# Patient Record
Sex: Female | Born: 1960 | Hispanic: No | Marital: Single | Smoking: Never smoker
Health system: Southern US, Community
[De-identification: ages and names within clinical notes are randomized; demographics above are authoritative.]

## PROBLEM LIST (undated history)

## (undated) DIAGNOSIS — D219 Benign neoplasm of connective and other soft tissue, unspecified: Secondary | ICD-10-CM

## (undated) DIAGNOSIS — K5900 Constipation, unspecified: Secondary | ICD-10-CM

## (undated) DIAGNOSIS — M79604 Pain in right leg: Secondary | ICD-10-CM

## (undated) DIAGNOSIS — E785 Hyperlipidemia, unspecified: Secondary | ICD-10-CM

## (undated) DIAGNOSIS — I10 Essential (primary) hypertension: Secondary | ICD-10-CM

## (undated) DIAGNOSIS — R0600 Dyspnea, unspecified: Secondary | ICD-10-CM

## (undated) DIAGNOSIS — E669 Obesity, unspecified: Secondary | ICD-10-CM

## (undated) DIAGNOSIS — K760 Fatty (change of) liver, not elsewhere classified: Secondary | ICD-10-CM

## (undated) DIAGNOSIS — I700 Atherosclerosis of aorta: Secondary | ICD-10-CM

## (undated) DIAGNOSIS — R600 Localized edema: Secondary | ICD-10-CM

## (undated) DIAGNOSIS — R188 Other ascites: Secondary | ICD-10-CM

---

## 1998-03-18 ENCOUNTER — Other Ambulatory Visit: Admission: RE | Admit: 1998-03-18 | Discharge: 1998-03-18 | Payer: Self-pay | Admitting: *Deleted

## 1998-06-12 HISTORY — PX: DILATION AND CURETTAGE OF UTERUS: SHX78

## 1999-05-16 ENCOUNTER — Other Ambulatory Visit: Admission: RE | Admit: 1999-05-16 | Discharge: 1999-05-16 | Payer: Self-pay | Admitting: *Deleted

## 2006-04-11 ENCOUNTER — Other Ambulatory Visit: Admission: RE | Admit: 2006-04-11 | Discharge: 2006-04-11 | Payer: Self-pay | Admitting: *Deleted

## 2017-09-03 ENCOUNTER — Inpatient Hospital Stay (HOSPITAL_COMMUNITY)
Admission: AD | Admit: 2017-09-03 | Discharge: 2017-09-03 | Disposition: A | Discharge status: Home / Self Care | Payer: 59 | Source: Ambulatory Visit | Attending: Obstetrics & Gynecology | Admitting: Obstetrics & Gynecology

## 2017-09-03 ENCOUNTER — Ambulatory Visit (HOSPITAL_COMMUNITY)
Admission: EM | Admit: 2017-09-03 | Discharge: 2017-09-03 | Disposition: A | Discharge status: Home / Self Care | Payer: 59 | Source: Home / Self Care | Attending: Family Medicine | Admitting: Family Medicine

## 2017-09-03 ENCOUNTER — Encounter (HOSPITAL_COMMUNITY): Payer: Self-pay | Admitting: Emergency Medicine

## 2017-09-03 DIAGNOSIS — R102 Pelvic and perineal pain: Secondary | ICD-10-CM

## 2017-09-03 DIAGNOSIS — N950 Postmenopausal bleeding: Secondary | ICD-10-CM

## 2017-09-03 DIAGNOSIS — K5900 Constipation, unspecified: Secondary | ICD-10-CM | POA: Diagnosis not present

## 2017-09-03 HISTORY — DX: Essential (primary) hypertension: I10

## 2017-09-03 HISTORY — DX: Benign neoplasm of connective and other soft tissue, unspecified: D21.9

## 2017-09-03 LAB — URINALYSIS, ROUTINE W REFLEX MICROSCOPIC
Bilirubin Urine: NEGATIVE
Glucose, UA: NEGATIVE mg/dL
Ketones, ur: NEGATIVE mg/dL
Nitrite: NEGATIVE
PROTEIN: NEGATIVE mg/dL
SPECIFIC GRAVITY, URINE: 1.014 (ref 1.005–1.030)
pH: 6 (ref 5.0–8.0)

## 2017-09-03 NOTE — MAU Note (Signed)
Patient presents with pelvic pain.  Hx of uterine fibroid with scheduled surgery at [facility] on April 12th.  Pt presents with worsening pain of 8/10 and has taken Aleve which has "not touched" the pain.  C/o light spotting.  Also c/o constipation she believes is r/t the size of the fibroid.

## 2017-09-03 NOTE — Discharge Instructions (Signed)
You may check in at [facility] if you would like immediate evaluation or if symptoms are worsening. Otherwise I would recommend calling to [facility] to set up appointment to establish care and for second opinion. Ibuprofen or aleve as needed for pain. May try miralax to help promote bowel movements. If develop worsening of pain, fevers, bleeding, weakness, or otherwise not improving please go to Er.

## 2017-09-03 NOTE — ED Triage Notes (Signed)
Pt sts lower abd pain from fibroids; pt has D/C scheduled on 4/12; pt sts pain down into right leg; pt sts some vaginal bleeding; pt sts pain is severe

## 2017-09-03 NOTE — ED Provider Notes (Signed)
[facility]    CSN: [id number] Arrival date & time: 09/03/17  1041     History   Chief Complaint No chief complaint on file.   HPI Rebecca [name] is a 57 y.o. female.   Itzayana presents with complaints of persistent abdominal and back pain as well as vaginal bleeding. This has been ongoing since end of January although worse in the past month. She was seen in er in [place] and was found to have fibroids. 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 She is scheduled to have d&c for testing and evaluation of endometrium 4/12. Saw her gynecology 3/20 with complete exam. States her symptoms have not changed since then but has not improved. She states she is concerned about waiting until 4/12 and would like a second opinion and possible further intervention. She states she has small amount of vaginal spotting. She is post menopausal. Occasional low back pain. She has not taken any medications for pain. Pain at times radiates down her right leg. She feels she has been more constipated than usual without nausea or vomiting. She takes lisinopril but otherwise without contributing medical history. She is not sexually active. Ca125 was drawn, unable to see result in chart review at this time. Patient states she is seeking assistance in finding a second opinion with gynecology.     ROS per HPI.      Past Medical History:  Diagnosis Date  . Fibroids   . Hypertension     There are no active problems to display for this patient.   History reviewed. No pertinent surgical history.  OB History   None      Home Medications    Prior to Admission medications   Medication Sig Start Date End Date Taking? Authorizing Provider  lisinopril (PRINIVIL,ZESTRIL) 20 MG tablet Take by mouth daily.   Yes [provider]    Family History History reviewed. No pertinent family  history.  Social History Social History   Tobacco Use  . Smoking status: Never Smoker  . Smokeless tobacco: Never Used  Substance Use Topics  . Alcohol use: Never    Frequency: Never  . Drug use: Never     Allergies   Patient has no known allergies.   Review of Systems Review of Systems   Physical Exam Triage Vital Signs ED Triage Vitals  Enc Vitals Group     BP 09/03/17 1158 132/73     Pulse Rate 09/03/17 1158 97     Resp 09/03/17 1158 18     Temp 09/03/17 1158 98.5 F (36.9 C)     Temp Source 09/03/17 1158 Oral     SpO2 09/03/17 1158 100 %     Weight --      Height --      Head Circumference --      Peak Flow --      Pain Score 09/03/17 1159 8     Pain Loc --      Pain Edu? --      Excl. in [place]? --    No data found.  Updated Vital Signs BP 132/73 (BP Location: Left Arm)   Pulse 97   Temp 98.5 F (36.9 C) (Oral)   Resp 18   SpO2 100%   Visual Acuity Right Eye Distance:   Left Eye Distance:   Bilateral Distance:    Right Eye Near:   Left  Eye Near:    Bilateral Near:     Physical Exam  Constitutional: She is oriented to person, place, and time. She appears well-developed and well-nourished. No distress.  Cardiovascular: Normal rate, regular rhythm and normal heart sounds.  Pulmonary/Chest: Effort normal and breath sounds normal.  Abdominal: Soft. Bowel sounds are normal. She exhibits distension. There is no hepatosplenomegaly. There is tenderness in the right lower quadrant, suprapubic area and left lower quadrant. There is no tenderness at McBurney's point and negative Murphy's sign.  Genitourinary:  Genitourinary Comments: Patient declines pelvic exam, states exam was completed just last week; endorses vaginal bleeding   Musculoskeletal:       Lumbar back: Normal.  Neurological: She is alert and oriented to person, place, and time.  Skin: Skin is warm and dry.     UC Treatments / Results  Labs (all labs ordered are listed, but only  abnormal results are displayed) Labs Reviewed - No data to display  EKG None Radiology No results found.  Procedures Procedures (including critical care time)  Medications Ordered in UC Medications - No data to display   Initial Impression / Assessment and Plan / UC Course  I have reviewed the triage vital signs and the nursing notes.  Pertinent labs & imaging results that were available during my care of the patient were reviewed by me and considered in my medical decision making (see chart for details).    Plan of care discussed at length with patient and what options we have here in urgent care. Symptoms have not changed, are not new at this time. Seeking second opinion from another gynecologist and requesting assistance in this. Declines pelvic exam, pelvic pain work up and evaluation has been previously completed. Urine, chem 8, xray deferred at this time. Recommended call to [facility] to set up appointment for follow up. Patient states that she is interested in more rapid evaluation, interesting in going to [facility] ED. Stable at this time, without acute red flag findings or acute distress. Encouraged establish and follow with gynecology for definitive treatment. Patient verbalized understanding and agreeable to plan.  Ambulatory out of clinic without difficulty.    Final Clinical Impressions(s) / UC Diagnoses   Final diagnoses:  Pelvic pain  PMB (postmenopausal bleeding)    ED Discharge Orders    None       Controlled Substance Prescriptions [place] Controlled Substance Registry consulted? Not Applicable   Zigmund Gottron, NP 09/03/17 1250

## 2017-09-03 NOTE — MAU Provider Note (Signed)
[name] is a 57 y.o. female who presents to MAU today after being advised by urgent care to come here. She reports pelvic pain and constipation since January. She has been evaluated with CT and pelvic US which showed uterine fibroids. She is being followed for this by her Gynecologist in [place], and is scheduled for [facility] on April 12th. Since arrival she is stating she doesn't think she needs to be here and declines evaluation.  BP 129/82 (BP Location: Right Arm)   Pulse 100   Temp 98.8 F (37.1 C) (Oral)   Resp 16   Ht 5\' 7"  (1.702 m)   Wt 182 lb 12 oz (82.9 kg)   BMI 28.62 kg/m   CONSTITUTIONAL: Well-developed, well-nourished female in no acute distress.  MUSCULOSKELETAL: Normal range of motion.  CARDIOVASCULAR: Regular heart rate RESPIRATORY: Normal effort NEUROLOGICAL: Alert and oriented to person, place, and time.  SKIN: No pallor. PSYCH: Normal mood and affect. Normal behavior. Normal judgment and thought content.  No results found for this or any previous visit (from the past 24 hour(s)).  MDM Pt doesn't feel comfortable with the plan for D&C in April and wants to switch to Dr. Radene Knee for a GYN provider and get a second opinion. I again offered to evaluate her here today and she declined.  A: Pelvic pain Constipation  P: Discharge home Patient may return to MAU as needed or if her condition were to change or worsen   Julianne Handler, [place]  09/03/2017 2:22 PM

## 2017-09-12 ENCOUNTER — Ambulatory Visit (INDEPENDENT_AMBULATORY_CARE_PROVIDER_SITE_OTHER): Payer: 59 | Admitting: Family Medicine

## 2017-09-12 ENCOUNTER — Encounter: Payer: Self-pay | Admitting: *Deleted

## 2017-09-12 ENCOUNTER — Encounter: Payer: Self-pay | Admitting: Family Medicine

## 2017-09-12 VITALS — BP 155/79 | HR 120 | Ht 67.0 in | Wt 183.0 lb

## 2017-09-12 DIAGNOSIS — R109 Unspecified abdominal pain: Secondary | ICD-10-CM | POA: Insufficient documentation

## 2017-09-12 DIAGNOSIS — N950 Postmenopausal bleeding: Secondary | ICD-10-CM

## 2017-09-12 DIAGNOSIS — R971 Elevated cancer antigen 125 [CA 125]: Secondary | ICD-10-CM | POA: Diagnosis not present

## 2017-09-12 DIAGNOSIS — R140 Abdominal distension (gaseous): Secondary | ICD-10-CM | POA: Diagnosis not present

## 2017-09-12 DIAGNOSIS — D259 Leiomyoma of uterus, unspecified: Secondary | ICD-10-CM

## 2017-09-12 DIAGNOSIS — R1030 Lower abdominal pain, unspecified: Secondary | ICD-10-CM

## 2017-09-12 NOTE — Progress Notes (Signed)
Pt was seen at [facility] buy Dr. Octaviano Glow who recommends EMB d/t EMS not being able to be measured on [place] and PMB.  Pt states Dr. Ethelene Hal attempted to do EMB but was not successful in office. She had D&C with EMB scheduled in OR but has since canceled that procedure.  She still wants to have procedure done but prefers [facility] and considered hysterectomy since CA125 is elevated.

## 2017-09-12 NOTE — Patient Instructions (Addendum)
You have constipation which is hard stools that are difficult to pass. It is important to have regular bowel movements every 1-3 days that are soft and easy to pass. Hard stools increase your risk of hemorrhoids and are very uncomfortable.   To prevent constipation you can increase the amount of fiber in your diet. Examples of foods with fiber are leafy greens, whole grain breads, oatmeal and other grains.  It is also important to drink at least eight 8oz glass of water everyday.   If you have not has a bowel movement in 4-5 days you made need to clean out your bowel.  This will have establish normal movement through your bowel.    After you are cleaned out: - Start Colace100mg  twice daily - Start Miralax once daily - Start a daily fiber supplement like metamucil or citrucel - if you are having diarrhea you can reduce to Colace once a day or miralax every other day or a 1/2 capful daily.

## 2017-09-12 NOTE — Progress Notes (Signed)
   GYNECOLOGY ANNUAL PREVENTATIVE CARE ENCOUNTER NOTE  Subjective:   Rebecca [name] is a 57 y.o. G18P0020 female here for a a problem GYN visit.  Current complaints: abdominal pain, constipation, feeling full, and vaginal bleeding.    Reports she had vaginal bleeding starting about 2 months ago. Describes as brown/red and needing to wear a panty ,liner. She was receiving care at a GYN in [place] who attempted an EMB in office that was unsuccessful. Ms Preece also had a TVUS which showed fibroids as well as a right adnexal mass. She reports that she also has developed stomaches during this time and she reports no relief with alleve which is only taking 1-2 times per week. She reports severe constipation for the past 2 weeks with a significant change in the diameter of her stool. She reports urges to have BM but low volume. She reports abdomial bloating and decreased appetite as well as feeling full earlier in eating.  She had stool CRC screening at age [age] which was negative.   Denies vaginal discharge, problems with intercourse or other gynecologic concerns. Denies dysuria/polyuria, denies blood in urine.    Gynecologic History No LMP recorded. Patient is postmenopausal. Contraception: none Last Pap: at GYN, records requested. Per patient negative Last mammogram: need to get results  The following portions of the patient's history were reviewed and updated as appropriate: allergies, current medications, past family history, past medical history, past social history, past surgical history and problem list.  Review of Systems Pertinent items are noted in HPI.   Objective:  BP (!) 155/79   Pulse (!) 120   Ht 5\' 7"  (1.702 m)   Wt 183 lb (83 kg)   BMI 28.66 kg/m  Gen: well appearing, NAD HEENT: no scleral icterus CV: RR Lung: Normal WOB Ext: warm well perfused   Assessment and Plan:   1. Postmenopausal bleeding- associated with abdominal pain, constipation, bloating, early satiety and  right adnexal mass (cystic/solid in nature) - concerns for endometrial pathology vs ovarian pathology. I am currently concerned about ovarian pathology in the setting of current sx and elevated CA-125. ddx also include a GI pathology, has not had yearly FOBT and last was several years ago.  - CEA - CBC - Ambulatory referral to [place] for CT scan that was done at [facility] sent as well as ROI to GYN in [place].  - patient would like all care through [facility]  2. Constipation- likely some of abdominal pain/bloating is related to this sx. -patient has tired miralax and castor oil with no effect. Only 1 small BM in 1 week - Rx for golytely bowel prep sent to pharmacy - Given instructions to drink entire prep and that this will cause frequent stooling - after clean out, recommend daily colace.    Please refer to After Visit Summary for other counseling recommendations.   Return if symptoms worsen or fail to improve.   >50% of this 30 minute visit was spent in direct coordination of care and counseling patient about the plan of care to have her seen by GYN ONC.  Caren Macadam, MD, MPH, ABFM Attending [place] for [facility]

## 2017-09-13 ENCOUNTER — Telehealth: Payer: Self-pay | Admitting: *Deleted

## 2017-09-13 DIAGNOSIS — N939 Abnormal uterine and vaginal bleeding, unspecified: Secondary | ICD-10-CM

## 2017-09-13 LAB — CEA: CEA1: 1.3 ng/mL (ref 0.0–4.7)

## 2017-09-13 LAB — CBC
Hematocrit: 35.1 % (ref 34.0–46.6)
Hemoglobin: 11.2 g/dL (ref 11.1–15.9)
MCH: 27.7 pg (ref 26.6–33.0)
MCHC: 31.9 g/dL (ref 31.5–35.7)
MCV: 87 fL (ref 79–97)
PLATELETS: 413 10*3/uL — AB (ref 150–379)
RBC: 4.04 x10E6/uL (ref 3.77–5.28)
RDW: 13.5 % (ref 12.3–15.4)
WBC: 9.4 10*3/uL (ref 3.4–10.8)

## 2017-09-13 NOTE — Telephone Encounter (Signed)
Called and left a message to have the office fax the last office note from Dr. Octaviano Glow.

## 2017-09-14 ENCOUNTER — Telehealth: Payer: Self-pay | Admitting: *Deleted

## 2017-09-14 NOTE — Telephone Encounter (Signed)
Called and spoke with patient, gave the appt date/time of April 10th at 10:45am

## 2017-09-15 ENCOUNTER — Emergency Department (HOSPITAL_COMMUNITY): Payer: 59

## 2017-09-15 ENCOUNTER — Emergency Department (HOSPITAL_COMMUNITY)
Admission: EM | Admit: 2017-09-15 | Discharge: 2017-09-15 | Disposition: A | Discharge status: Home / Self Care | Payer: 59 | Attending: Emergency Medicine | Admitting: Emergency Medicine

## 2017-09-15 ENCOUNTER — Other Ambulatory Visit: Payer: Self-pay

## 2017-09-15 ENCOUNTER — Encounter (HOSPITAL_COMMUNITY): Payer: Self-pay

## 2017-09-15 ENCOUNTER — Emergency Department (HOSPITAL_BASED_OUTPATIENT_CLINIC_OR_DEPARTMENT_OTHER): Admit: 2017-09-15 | Discharge: 2017-09-15 | Disposition: A | Discharge status: Home / Self Care | Payer: 59

## 2017-09-15 DIAGNOSIS — R188 Other ascites: Secondary | ICD-10-CM | POA: Diagnosis not present

## 2017-09-15 DIAGNOSIS — I10 Essential (primary) hypertension: Secondary | ICD-10-CM | POA: Diagnosis not present

## 2017-09-15 DIAGNOSIS — N939 Abnormal uterine and vaginal bleeding, unspecified: Secondary | ICD-10-CM

## 2017-09-15 DIAGNOSIS — Z79899 Other long term (current) drug therapy: Secondary | ICD-10-CM | POA: Insufficient documentation

## 2017-09-15 DIAGNOSIS — R2241 Localized swelling, mass and lump, right lower limb: Secondary | ICD-10-CM | POA: Diagnosis not present

## 2017-09-15 DIAGNOSIS — R609 Edema, unspecified: Secondary | ICD-10-CM | POA: Diagnosis not present

## 2017-09-15 DIAGNOSIS — R1010 Upper abdominal pain, unspecified: Secondary | ICD-10-CM | POA: Diagnosis present

## 2017-09-15 DIAGNOSIS — R000 Tachycardia, unspecified: Secondary | ICD-10-CM | POA: Insufficient documentation

## 2017-09-15 LAB — URINALYSIS, ROUTINE W REFLEX MICROSCOPIC
Bilirubin Urine: NEGATIVE
Glucose, UA: NEGATIVE mg/dL
Ketones, ur: NEGATIVE mg/dL
NITRITE: NEGATIVE
PH: 6 (ref 5.0–8.0)
Protein, ur: NEGATIVE mg/dL
SPECIFIC GRAVITY, URINE: 1.016 (ref 1.005–1.030)

## 2017-09-15 LAB — COMPREHENSIVE METABOLIC PANEL
ALK PHOS: 55 U/L (ref 38–126)
ALT: 27 U/L (ref 14–54)
ANION GAP: 12 (ref 5–15)
AST: 28 U/L (ref 15–41)
Albumin: 3 g/dL — ABNORMAL LOW (ref 3.5–5.0)
BUN: 16 mg/dL (ref 6–20)
CALCIUM: 9.4 mg/dL (ref 8.9–10.3)
CO2: 27 mmol/L (ref 22–32)
Chloride: 99 mmol/L — ABNORMAL LOW (ref 101–111)
Creatinine, Ser: 0.82 mg/dL (ref 0.44–1.00)
GFR calc non Af Amer: >60 mL/min (ref >60)
Glucose, Bld: 126 mg/dL — ABNORMAL HIGH (ref 65–99)
POTASSIUM: 3.5 mmol/L (ref 3.5–5.1)
SODIUM: 138 mmol/L (ref 135–145)
Total Bilirubin: 0.6 mg/dL (ref 0.3–1.2)
Total Protein: 7.9 g/dL (ref 6.5–8.1)

## 2017-09-15 LAB — CBC WITH DIFFERENTIAL/PLATELET
Basophils Absolute: 0 10*3/uL (ref 0.0–0.1)
Basophils Relative: 0 %
EOS PCT: 2 %
Eosinophils Absolute: 0.2 10*3/uL (ref 0.0–0.7)
HCT: 31.3 % — ABNORMAL LOW (ref 36.0–46.0)
Hemoglobin: 10.2 g/dL — ABNORMAL LOW (ref 12.0–15.0)
LYMPHS ABS: 1.9 10*3/uL (ref 0.7–4.0)
Lymphocytes Relative: 20 %
MCH: 27.9 pg (ref 26.0–34.0)
MCHC: 32.6 g/dL (ref 30.0–36.0)
MCV: 85.5 fL (ref 78.0–100.0)
MONOS PCT: 7 %
Monocytes Absolute: 0.7 10*3/uL (ref 0.1–1.0)
Neutro Abs: 7 10*3/uL (ref 1.7–7.7)
Neutrophils Relative %: 71 %
PLATELETS: 405 10*3/uL — AB (ref 150–400)
RBC: 3.66 MIL/uL — ABNORMAL LOW (ref 3.87–5.11)
RDW: 13.5 % (ref 11.5–15.5)
WBC: 9.8 10*3/uL (ref 4.0–10.5)

## 2017-09-15 LAB — LIPASE, BLOOD: LIPASE: 39 U/L (ref 11–51)

## 2017-09-15 LAB — I-STAT TROPONIN, ED: TROPONIN I, POC: 0 ng/mL (ref 0.00–0.08)

## 2017-09-15 LAB — D-DIMER, QUANTITATIVE (NOT AT ARMC)

## 2017-09-15 MED ORDER — IOPAMIDOL (ISOVUE-370) INJECTION 76%
INTRAVENOUS | Status: AC
  Administered 2017-09-15: 100 mL via INTRAVENOUS
  Filled 2017-09-15: qty 100

## 2017-09-15 MED ORDER — FENTANYL CITRATE (PF) 100 MCG/2ML IJ SOLN
100.0000 ug | Freq: Once | INTRAMUSCULAR | Status: AC
Start: 2017-09-15 — End: 2017-09-15
  Administered 2017-09-15: 100 ug via INTRAVENOUS
  Filled 2017-09-15: qty 2

## 2017-09-15 MED ORDER — SODIUM CHLORIDE 0.9 % IV BOLUS
1000.0000 mL | Freq: Once | INTRAVENOUS | Status: AC
  Administered 2017-09-15: 1000 mL via INTRAVENOUS

## 2017-09-15 NOTE — Progress Notes (Signed)
VASCULAR LAB PRELIMINARY  PRELIMINARY  PRELIMINARY  PRELIMINARY  Left lower extremity venous duplex completed.    Preliminary report:  There is no DVT or SVT noted in the bilateral lower extremities. Continuous waveforms are present throughout the left lower extremity and in the right common femoral and proximal femoral veins.  Bilateral external iliac veins appear patent.  Multiple enlarged inguinal lymph nodes noted bilaterally.   Gave report to Dr. Edwena Bunde, [place], RVT 09/15/2017, 7:36 PM

## 2017-09-15 NOTE — ED Notes (Signed)
Ultrasound at bedside

## 2017-09-15 NOTE — ED Provider Notes (Addendum)
[facility] DEPT Provider Note   CSN: [id number] Arrival date & time: 09/15/17  1728     History   Chief Complaint Chief Complaint  Patient presents with  . Abdominal Pain  . Shortness of Breath  . Leg Pain    right  . leg sweling    left    HPI Rebecca [name] is a 57 y.o. female.  HPI  57 year old female presents with multiple complaints.  Her chief complaint is upper abdominal pain.  She states this is been ongoing for about a week on and off.  Seems to be most worse when she eats, she feels like she is just piling food on top of food but never digested.  The pain currently is about a 7 out of 10.  She was recently diagnosed with fibroids and has been having postmenopausal bleeding and has an oncology appointment this upcoming week.  She states the fibroids are still bleeding but her lower abdomen is not hurting currently.  She also notes that her right lateral thigh has been hurting as well as her left lower extremity being swollen compared to the right.  The thigh only hurts occasionally on the right side.  The right leg has not been swollen.  Over the past week or so she is also noticed shortness of breath when walking around her house or doing activities.  No shortness of breath at rest.  No chest pain or cough.  She has been having long-standing history is with constipation and recently has been trying MiraLAX and was even prescribed GoLYTELY.  She is having bowel movements each day but they are small.  They are not physically hard.  Past Medical History:  Diagnosis Date  . Fibroids   . Hypertension     Patient Active Problem List   Diagnosis Date Noted  . Postmenopausal bleeding 09/12/2017  . Abdominal pain 09/12/2017  . Bloating 09/12/2017    Past Surgical History:  Procedure Laterality Date  . DILATION AND CURETTAGE OF UTERUS  2000   SAB     OB History    Gravida  2   Para  0   Term  0   Preterm  0   AB  2   Living  0     SAB  2   TAB  0   Ectopic  0   Multiple  0   Live Births  0            Home Medications    Prior to Admission medications   Medication Sig Start Date End Date Taking? Authorizing Provider  lisinopril (PRINIVIL,ZESTRIL) 20 MG tablet Take by mouth daily.    [provider]  Multiple Vitamin (MULTIVITAMIN) tablet Take 1 tablet by mouth daily.    [provider]    Family History Family History  Problem Relation Age of Onset  . Hypertension Mother   . Breast cancer Maternal Aunt 45    Social History Social History   Tobacco Use  . Smoking status: Never Smoker  . Smokeless tobacco: Never Used  Substance Use Topics  . Alcohol use: Never    Frequency: Never  . Drug use: Never     Allergies   Patient has no known allergies.   Review of Systems Review of Systems  Constitutional: Negative for fever.  Respiratory: Positive for shortness of breath. Negative for cough.   Cardiovascular: Positive for leg swelling. Negative for chest pain.  Gastrointestinal: Positive for abdominal pain  and constipation. Negative for vomiting.  Genitourinary: Positive for vaginal bleeding.  All other systems reviewed and are negative.    Physical Exam Updated Vital Signs BP 132/73 (BP Location: Left Arm)   Pulse (!) 102   Temp 99.3 F (37.4 C) (Oral)   Resp (!) 24   Ht 5\' 7"  (1.702 m)   Wt 83 kg (183 lb)   SpO2 100%   BMI 28.66 kg/m   Physical Exam  Constitutional: She is oriented to person, place, and time. She appears well-developed and well-nourished.  Non-toxic appearance. She does not appear ill. No distress.  HENT:  Head: Normocephalic and atraumatic.  Right Ear: External ear normal.  Left Ear: External ear normal.  Nose: Nose normal.  Eyes: Right eye exhibits no discharge. Left eye exhibits no discharge.  Cardiovascular: Regular rhythm and normal heart sounds. Tachycardia present.  Pulses:      Dorsalis pedis pulses are 2+ on the right  side, and 2+ on the left side.  Pulmonary/Chest: Effort normal and breath sounds normal.  Abdominal: Soft. There is tenderness in the right upper quadrant, epigastric area and left upper quadrant.  Musculoskeletal:  LLE mildly swollen compared to right, including calf, ankle, foot.   Neurological: She is alert and oriented to person, place, and time.  Skin: Skin is warm and dry.  Nursing note and vitals reviewed.    ED Treatments / Results  Labs (all labs ordered are listed, but only abnormal results are displayed) Labs Reviewed  COMPREHENSIVE METABOLIC PANEL - Abnormal; Notable for the following components:      Result Value   Chloride 99 (*)    Glucose, Bld 126 (*)    Albumin 3.0 (*)    All other components within normal limits  URINALYSIS, ROUTINE W REFLEX MICROSCOPIC - Abnormal; Notable for the following components:   Hgb urine dipstick MODERATE (*)    Leukocytes, UA MODERATE (*)    Bacteria, UA RARE (*)    Squamous Epithelial / LPF 0-5 (*)    All other components within normal limits  CBC WITH DIFFERENTIAL/PLATELET - Abnormal; Notable for the following components:   RBC 3.66 (*)    Hemoglobin 10.2 (*)    HCT 31.3 (*)    Platelets 405 (*)    All other components within normal limits  D-DIMER, QUANTITATIVE (NOT AT [facility]) - Abnormal; Notable for the following components:   D-Dimer, Quant >20.00 (*)    All other components within normal limits  LIPASE, BLOOD  I-STAT TROPONIN, ED    EKG EKG Interpretation  Date/Time:  Saturday September 15 2017 18:06:21 EDT Ventricular Rate:  117 PR Interval:    QRS Duration: 84 QT Interval:  305 QTC Calculation: 426 R Axis:   54 Text Interpretation:  Sinus tachycardia no acute ST/T changes No old tracing to compare Confirmed by Sherwood Gambler [phone]) on 09/15/2017 6:15:41 PM   Radiology Dg Chest 2 View  Result Date: 09/15/2017 CLINICAL DATA:  Shortness of breath EXAM: CHEST - 2 VIEW COMPARISON:  None. FINDINGS: Low volume film.  Streaky opacity in the posterior right lung base compatible with atelectasis or possible pneumonia. Left lung clear. The cardiopericardial silhouette is within normal limits for size. The visualized bony structures of the thorax are intact. Telemetry leads overlie the chest. IMPRESSION: Posterior right basilar opacity compatible with atelectasis or pneumonia. Electronically Signed   By: Misty Stanley M.D.   On: 09/15/2017 20:07   Ct Angio Chest Pe W/cm &/or Wo Cm  Result  Date: 09/15/2017 CLINICAL DATA:  Intermittent mid upper abdominal pain for 1 week. Shortness of breath 2 days ago. Right leg pain and left leg swelling yesterday. Lower abdominal pain. Positive D-dimer. Abnormal vaginal bleeding. EXAM: CT ANGIOGRAPHY CHEST CT ABDOMEN AND PELVIS WITH CONTRAST TECHNIQUE: Multidetector CT imaging of the chest was performed using the standard protocol during bolus administration of intravenous contrast. Multiplanar CT image reconstructions and MIPs were obtained to evaluate the vascular anatomy. Multidetector CT imaging of the abdomen and pelvis was performed using the standard protocol during bolus administration of intravenous contrast. CONTRAST:  100 mL Isovue 370 COMPARISON:  CT abdomen and pelvis 06/21/2017 FINDINGS: CTA CHEST FINDINGS Cardiovascular: Satisfactory opacification of the pulmonary arteries to the segmental level. No evidence of pulmonary embolism. Normal heart size. No pericardial effusion. Normal caliber thoracic aorta. No aortic dissection. Focal aortic calcification. Great vessel origins are patent. Mediastinum/Nodes: No enlarged mediastinal, hilar, or axillary lymph nodes. Thyroid gland, trachea, and esophagus demonstrate no significant findings. Lungs/Pleura: Atelectasis in the lung bases. No focal consolidation. No pleural effusions. No pneumothorax. Airways are patent. Musculoskeletal: Degenerative changes in the spine. No destructive bone lesions. Review of the MIP images confirms the above  findings. CT ABDOMEN and PELVIS FINDINGS Hepatobiliary: Mild diffuse fatty infiltration of the liver. Gallbladder and bile ducts are unremarkable. Small amount of free fluid around the liver. Pancreas: Unremarkable. No pancreatic ductal dilatation or surrounding inflammatory changes. Spleen: Normal in size without focal abnormality. Small amount of free fluid around the spleen. Adrenals/Urinary Tract: Subcentimeter cyst in the left kidney. No solid mass identified. No hydronephrosis or hydroureter. Renal nephrograms are symmetrical. No adrenal gland nodules. Bladder is mostly decompressed without evidence of filling defect or significant wall thickening. Stomach/Bowel: Stomach, small bowel, and colon are not abnormally distended. Scattered stool in the colon. Small amount of free fluid is demonstrated in the anterior mesentery. No definite loculation. Possible nodular infiltration in the mesentery and omentum. Vascular/Lymphatic: Aortic atherosclerosis. No enlarged abdominal or pelvic lymph nodes. Distal IVC is focally flattened. No definite thrombus demonstrated in the IVC or iliac veins. Visualized common femoral veins also appear patent. No significant retroperitoneal lymphadenopathy. Prominent lymph nodes in the groin regions bilaterally measuring up to about 1.3 cm in maximal diameter. These are nonspecific and are probably reactive but lymphoproliferative disorder or metastatic disease not entirely excluded. Groin lymph nodes are similar to previous study. Reproductive: Prominent heterogeneous multinodular enlargement of the uterus, measuring up to about 7.8 x 10.3 cm. Many nodules have low-attenuation centrally suggesting multiple necrotic fibroids. Ovaries are not definitively identified but would be difficult to separate from the enlarged uterus. Cervical region appears prominent. Endometrium is not separated from the multiple fibroids. Consider ultrasound of the pelvis for further characterization.  Appearances are similar to the previous study. Other: No free air in the abdomen. Mild edema in the subcutaneous fat. Abdominal wall musculature appears intact. Musculoskeletal: No acute or significant osseous findings. Review of the MIP images confirms the above findings. IMPRESSION: 1. Mild diffuse fatty infiltration of the liver. 2. Small amount of free fluid in the abdomen likely to represent ascites. Possible nodular infiltration in the mesentery and omentum. Can't entirely exclude pseudomyxoma peritonei. 3. 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 Ovaries are not separately identified. Cervix appears prominent. Suggest ultrasound of the pelvis for further evaluation. 4. Visualized major abdominal and pelvic veins appear patent although there is focal flattening of the distal IVC of nonspecific etiology. 5. Nonspecific prominence of lymph nodes  in the groin. 6. Aortic atherosclerosis. 7. No evidence of significant pulmonary embolus. 8. No evidence of active pulmonary disease. Electronically Signed   By: Lucienne Capers M.D.   On: 09/15/2017 22:58   Ct Abdomen Pelvis W Contrast  Result Date: 09/15/2017 CLINICAL DATA:  Intermittent mid upper abdominal pain for 1 week. Shortness of breath 2 days ago. Right leg pain and left leg swelling yesterday. Lower abdominal pain. Positive D-dimer. Abnormal vaginal bleeding. EXAM: CT ANGIOGRAPHY CHEST CT ABDOMEN AND PELVIS WITH CONTRAST TECHNIQUE: Multidetector CT imaging of the chest was performed using the standard protocol during bolus administration of intravenous contrast. Multiplanar CT image reconstructions and MIPs were obtained to evaluate the vascular anatomy. Multidetector CT imaging of the abdomen and pelvis was performed using the standard protocol during bolus administration of intravenous contrast. CONTRAST:  100 mL Isovue 370 COMPARISON:  CT abdomen and pelvis 06/21/2017 FINDINGS: CTA CHEST FINDINGS  Cardiovascular: Satisfactory opacification of the pulmonary arteries to the segmental level. No evidence of pulmonary embolism. Normal heart size. No pericardial effusion. Normal caliber thoracic aorta. No aortic dissection. Focal aortic calcification. Great vessel origins are patent. Mediastinum/Nodes: No enlarged mediastinal, hilar, or axillary lymph nodes. Thyroid gland, trachea, and esophagus demonstrate no significant findings. Lungs/Pleura: Atelectasis in the lung bases. No focal consolidation. No pleural effusions. No pneumothorax. Airways are patent. Musculoskeletal: Degenerative changes in the spine. No destructive bone lesions. Review of the MIP images confirms the above findings. CT ABDOMEN and PELVIS FINDINGS Hepatobiliary: Mild diffuse fatty infiltration of the liver. Gallbladder and bile ducts are unremarkable. Small amount of free fluid around the liver. Pancreas: Unremarkable. No pancreatic ductal dilatation or surrounding inflammatory changes. Spleen: Normal in size without focal abnormality. Small amount of free fluid around the spleen. Adrenals/Urinary Tract: Subcentimeter cyst in the left kidney. No solid mass identified. No hydronephrosis or hydroureter. Renal nephrograms are symmetrical. No adrenal gland nodules. Bladder is mostly decompressed without evidence of filling defect or significant wall thickening. Stomach/Bowel: Stomach, small bowel, and colon are not abnormally distended. Scattered stool in the colon. Small amount of free fluid is demonstrated in the anterior mesentery. No definite loculation. Possible nodular infiltration in the mesentery and omentum. Vascular/Lymphatic: Aortic atherosclerosis. No enlarged abdominal or pelvic lymph nodes. Distal IVC is focally flattened. No definite thrombus demonstrated in the IVC or iliac veins. Visualized common femoral veins also appear patent. No significant retroperitoneal lymphadenopathy. Prominent lymph nodes in the groin regions  bilaterally measuring up to about 1.3 cm in maximal diameter. These are nonspecific and are probably reactive but lymphoproliferative disorder or metastatic disease not entirely excluded. Groin lymph nodes are similar to previous study. Reproductive: Prominent heterogeneous multinodular enlargement of the uterus, measuring up to about 7.8 x 10.3 cm. Many nodules have low-attenuation centrally suggesting multiple necrotic fibroids. Ovaries are not definitively identified but would be difficult to separate from the enlarged uterus. Cervical region appears prominent. Endometrium is not separated from the multiple fibroids. Consider ultrasound of the pelvis for further characterization. Appearances are similar to the previous study. Other: No free air in the abdomen. Mild edema in the subcutaneous fat. Abdominal wall musculature appears intact. Musculoskeletal: No acute or significant osseous findings. Review of the MIP images confirms the above findings. IMPRESSION: 1. Mild diffuse fatty infiltration of the liver. 2. Small amount of free fluid in the abdomen likely to represent ascites. Possible nodular infiltration in the mesentery and omentum. Can't entirely exclude pseudomyxoma peritonei. 3. 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  Ovaries are not separately identified. Cervix appears prominent. Suggest ultrasound of the pelvis for further evaluation. 4. Visualized major abdominal and pelvic veins appear patent although there is focal flattening of the distal IVC of nonspecific etiology. 5. Nonspecific prominence of lymph nodes in the groin. 6. Aortic atherosclerosis. 7. No evidence of significant pulmonary embolus. 8. No evidence of active pulmonary disease. Electronically Signed   By: Lucienne Capers M.D.   On: 09/15/2017 22:58   Dg Abd 2 Views  Result Date: 09/15/2017 CLINICAL DATA:  Patient states intermittent mid upper abdominal pain x 1 week with  constipation. SOB 2 days ago. Hx of HTN. Patient states no known heart, lung, or abdominal conditions or surgical hx. EXAM: ABDOMEN - 2 VIEW COMPARISON:  CT, 06/21/2017 FINDINGS: Normal bowel gas pattern. Mild increase colonic stool burden. No evidence of renal or ureteral stones. Soft tissues are unremarkable. No significant skeletal abnormality. Clear lung bases. IMPRESSION: 1. No acute findings.  No bowel obstruction. 2. Mild increase colonic stool burden. Electronically Signed   By: Lajean Manes M.D.   On: 09/15/2017 20:07   US Abdomen Limited Ruq  Result Date: 09/15/2017 CLINICAL DATA:  Right upper quadrant pain for 1 week EXAM: ULTRASOUND ABDOMEN LIMITED RIGHT UPPER QUADRANT COMPARISON:  None. FINDINGS: Gallbladder: Gallbladder is decompressed with pseudo wall thickening. Gallbladder sludge is noted. No definitive calculi are seen. No pericholecystic fluid is noted. Common bile duct: Diameter: 2.9 mm. Liver: No focal lesion identified. Within normal limits in parenchymal echogenicity. Portal vein is patent on color Doppler imaging with normal direction of blood flow towards the liver. Minimal perihepatic ascites is noted of uncertain significance. IMPRESSION: Minimal ascites. Gallbladder sludge and evidence of gallbladder decompression. No cholelithiasis is noted. Electronically Signed   By: Inez Catalina M.D.   On: 09/15/2017 19:35   VASCULAR LAB PRELIMINARY  PRELIMINARY  PRELIMINARY  PRELIMINARY  Left lower extremity venous duplex completed.    Preliminary report:  There is no DVT or SVT noted in the bilateral lower extremities. Continuous waveforms are present throughout the left lower extremity and in the right common femoral and proximal femoral veins.  Bilateral external iliac veins appear patent.  Multiple enlarged inguinal lymph nodes noted bilaterally.   Gave report to Dr. Edwena Bunde, [facility], RVT 09/15/2017, 7:36 PM    Procedures Procedures (including critical care  time)  Medications Ordered in ED Medications  sodium chloride 0.9 % bolus 1,000 mL (0 mLs Intravenous Stopped 09/15/17 2000)  fentaNYL (SUBLIMAZE) injection 100 mcg (100 mcg Intravenous Given 09/15/17 1849)  iopamidol (ISOVUE-370) 76 % injection (100 mLs Intravenous Contrast Given 09/15/17 2218)     Initial Impression / Assessment and Plan / ED Course  I have reviewed the triage vital signs and the nursing notes.  Pertinent labs & imaging results that were available during my care of the patient were reviewed by me and considered in my medical decision making (see chart for details).     Patient's workup shows a significant elevated d-dimer and so CT of the chest was obtained.  This does not show PE. No DVT on LLE. Given no clear etiology for her abdominal pain with a negative right upper quadrant ultrasound besides mild ascites that is new, CT abdomen and pelvis were obtained.  This shows multiple findings but no emergent pathology.  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  She is noted to be tachycardic in the low 100s.  Occasionally when talking it will go to the 110s.  Unclear cause that she is resting comfortably in has only minimal pain.  I noted that when she was seen a couple days ago at gynecology her heart rate was 120.  I think this may be contributing to why she is feeling short of breath but at this point there is no emergent pathology noted that would cause it.  She does have some anemia with a hemoglobin of 10 compared to 11 a couple days ago.  However she does not describe heavy bleeding with only changing one and a half pads per day.  She does not appear to be hemorrhaging.  UA with blood and some leukocytes but no urinary tract symptoms. Thus not c/w UTI. I think she is stable for discharge home despite her mild tachycardia.  We discussed the importance of following up with her oncologist.  Return precautions.  Final  Clinical Impressions(s) / ED Diagnoses   Final diagnoses:  Upper abdominal pain  Sinus tachycardia  Other ascites    ED Discharge Orders    None       Sherwood Gambler, MD 09/15/17 3419    Sherwood Gambler, MD 09/15/17 2332

## 2017-09-15 NOTE — ED Triage Notes (Signed)
Patient c/o intermittent mid upper abdominal pain x 1 week. SOB 2 days ago. Right leg pain and left leg swelling yesterday.

## 2017-09-19 ENCOUNTER — Inpatient Hospital Stay: Payer: 59 | Attending: Gynecologic Oncology | Admitting: Gynecologic Oncology

## 2017-09-19 ENCOUNTER — Encounter: Payer: Self-pay | Admitting: Gynecologic Oncology

## 2017-09-19 VITALS — BP 146/75 | HR 118 | Temp 98.3°F | Resp 20 | Ht 67.0 in | Wt 185.3 lb

## 2017-09-19 DIAGNOSIS — R102 Pelvic and perineal pain: Secondary | ICD-10-CM | POA: Diagnosis not present

## 2017-09-19 DIAGNOSIS — N898 Other specified noninflammatory disorders of vagina: Secondary | ICD-10-CM | POA: Diagnosis not present

## 2017-09-19 DIAGNOSIS — R971 Elevated cancer antigen 125 [CA 125]: Secondary | ICD-10-CM | POA: Insufficient documentation

## 2017-09-19 DIAGNOSIS — N888 Other specified noninflammatory disorders of cervix uteri: Secondary | ICD-10-CM

## 2017-09-19 DIAGNOSIS — R590 Localized enlarged lymph nodes: Secondary | ICD-10-CM | POA: Diagnosis not present

## 2017-09-19 DIAGNOSIS — N949 Unspecified condition associated with female genital organs and menstrual cycle: Secondary | ICD-10-CM | POA: Diagnosis not present

## 2017-09-19 DIAGNOSIS — N950 Postmenopausal bleeding: Secondary | ICD-10-CM | POA: Insufficient documentation

## 2017-09-19 DIAGNOSIS — R1900 Intra-abdominal and pelvic swelling, mass and lump, unspecified site: Secondary | ICD-10-CM | POA: Diagnosis not present

## 2017-09-19 DIAGNOSIS — N9489 Other specified conditions associated with female genital organs and menstrual cycle: Secondary | ICD-10-CM

## 2017-09-19 MED ORDER — OXYCODONE HCL 5 MG PO TABS: 5.0000 mg | ORAL_TABLET | ORAL | 0 refills | Status: AC | PRN

## 2017-09-19 NOTE — H&P (View-Only) (Signed)
Consult Note: Gyn-Onc  Consult was requested by Dr. Octaviano Glow and Dr Lauretta Chester for the evaluation of Rebecca [name] 57 y.o. female  CC:  Chief Complaint  Patient presents with  . Postmenopausal bleeding  . Adnexal mass right  . Elevated CA-125    Assessment/Plan:  Ms. Rebecca [name]  is a 57 y.o.  year old with exam findings concerning for metastatic cervical versus uterine cancer.  The patient does not tolerate an examination due to extreme pain.  The cervix is palpably grossly abnormal with bilateral parametrial extension.  Therefore I am recommending an examination under anesthesia with cervical biopsies and possible endometrial biopsy or D&C to establish histologic diagnosis.  Additionally I am recommending biopsy of the palpably enlarged inguinal lymph nodes to define if this is a stage IV process.  If malignancy is confirmed on either of the specimens we will obtain PET imaging to better delineate the extent of her disease.  Her CT scan is suggestive of disease within the peritoneal cavity with possibly omental and mesenteric nodularity.  I discussed with the patient that while he do not presently have a confirmed diagnosis of malignancy, and given my exam findings and my evaluation of the CT scan and elevated Ca1 25, I am very concerned that she has an advanced gynecologic malignancy which is not amenable to surgical resection.  Treatment intervention will likely involve either primary chemoradiation versus chemotherapy depending upon the malignant histologic cell type.  Her pelvic pain is severe therefore I have prescribed oxycodone.    HPI: Rebecca [name] is a very pleasant 57 year old P0 who was seen in consultation at the request of Dr. Ernestina Patches for postmenopausal bleeding, discharge, pelvic pain, elevated Ca1 25, and abnormal CT scan.  The patient reports a two-month history of vaginal bleeding that began in February 2019.  It was not heavy bleeding and requiring a panty  liner. She was then evaluated with a transvaginal ultrasound on August 29, 2017.  This revealed an anteverted uterus measuring 11 x 7.2 x 7.6 cm with 2 3-4 cm fibroids.  The endometrium was seen with fluid and unable to visualize well during to the multiple myomas.  Bilateral ovaries were not seen.  However in the right adnexa solid cystic mass measuring 4.7 cm which was avascular was seen is unclear if this was an ovary or uterine myoma.  There is no free fluid in the cul-de-sac.  She then developed rapid onset unilateral right lower extremity edema that was significant and she was seen by an urgent care for evaluation and then there was a long ED.  A CT scan of the abdomen and pelvis on 09/15/17  were performed to evaluate for potential cause for right leg pain and swelling.  This revealed no evidence of venous occlusive disease.  However there were prominent lymph nodes in the groin regions bilaterally measuring up to 1.3 cm.  Within the abdomen there is a small amount of fluid around the liver.  There is possible nodular infiltration in the mesentery and omentum.  The uterus was prominent and measured 7.8 x 10.3 cm with multiple fibroids.  The endometrium was difficult to delineate.  The ovaries were not clearly identified separate from the enlarged uterus.    Ca1 25 was drawn on August 30, 2017 and was elevated at 97.3.  CEA was normal.  The patient reports that her last Pap smear was approximately 5 years ago and was normal.  She denies ever having a prior abnormal Pap smear.  She has had 2 first trimester miscarriages but no full-term pregnancies.  She is otherwise healthy with respect to medical comorbidities with the exception of hypertension.  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  Current Meds:  Outpatient Encounter Medications as of 09/19/2017  Medication Sig  . lisinopril-hydrochlorothiazide (PRINZIDE,ZESTORETIC) 20-12.5 MG tablet Take 1 tablet by mouth daily.  . Multiple Vitamin  (MULTIVITAMIN) tablet Take 1 tablet by mouth daily.  Marland Kitchen oxyCODONE (OXY IR/ROXICODONE) 5 MG immediate release tablet Take 1 tablet (5 mg total) by mouth every 4 (four) hours as needed for up to 7 days for severe pain.  . [DISCONTINUED] lisinopril (PRINIVIL,ZESTRIL) 20 MG tablet Take by mouth daily.   No facility-administered encounter medications on file as of 09/19/2017.     Allergy: No Known Allergies  Social Hx:   Social History   Socioeconomic History  . Marital status: Single    Spouse name: Not on file  . Number of children: Not on file  . Years of education: Not on file  . Highest education level: Not on file  Occupational History  . Not on file  Social Needs  . Financial resource strain: Not on file  . Food insecurity:    Worry: Not on file    Inability: Not on file  . Transportation needs:    Medical: Not on file    Non-medical: Not on file  Tobacco Use  . Smoking status: Never Smoker  . Smokeless tobacco: Never Used  Substance and Sexual Activity  . Alcohol use: Never    Frequency: Never  . Drug use: Never  . Sexual activity: Not Currently    Birth control/protection: Post-menopausal  Lifestyle  . Physical activity:    Days per week: Not on file    Minutes per session: Not on file  . Stress: Not on file  Relationships  . Social connections:    Talks on phone: Not on file    Gets together: Not on file    Attends religious service: Not on file    Active member of club or organization: Not on file    Attends meetings of clubs or organizations: Not on file    Relationship status: Not on file  . Intimate partner violence:    Fear of current or ex partner: Not on file    Emotionally abused: Not on file    Physically abused: Not on file    Forced sexual activity: Not on file  Other Topics Concern  . Not on file  Social History Narrative  . Not on file    Past Surgical Hx:  Past Surgical History:  Procedure Laterality Date  . DILATION AND CURETTAGE OF  UTERUS  2000   SAB    Past Medical Hx:  Past Medical History:  Diagnosis Date  . Fibroids   . Hypertension     Past Gynecological History:  [id number] No LMP recorded. Patient is postmenopausal.  Family Hx:  Family History  Problem Relation Age of Onset  . Hypertension Mother   . Breast cancer Maternal Aunt 50    Review of Systems:  Constitutional  Feels well,    ENT Normal appearing ears and nares bilaterally Skin/Breast  No rash, sores, jaundice, itching, dryness Cardiovascular  No chest pain, shortness of breath, or edema  Pulmonary  No cough or wheeze.  Gastro Intestinal  No nausea, vomitting, or diarrhoea. No bright red blood per rectum, no abdominal pain, change in bowel movement, or constipation.  Genito Urinary  No frequency, urgency, dysuria, + bleeding and discharge + pelvic pain Musculo Skeletal  + right lower extremity edema - fluctuates Neurologic  No weakness, numbness, change in gait,  Psychology  No depression, anxiety, insomnia.   Vitals:  Blood pressure (!) 146/75, pulse (!) 118, temperature 98.3 F (36.8 C), temperature source Oral, resp. rate 20, height 5\' 7"  (1.702 m), weight 185 lb 4.8 oz (84.1 kg), SpO2 100 %.  Physical Exam: WD in NAD Neck  Supple NROM, without any enlargements.  Lymph Node Survey + bilateral palpable inguinal lymphadenopathy Cardiovascular  Pulse normal rate, regularity and rhythm. S1 and S2 normal.  Lungs  Clear to auscultation bilateraly, without wheezes/crackles/rhonchi. Good air movement.  Skin  No rash/lesions/breakdown  Psychiatry  Alert and oriented to person, place, and time  Abdomen  Normoactive bowel sounds, abdomen soft, non-tender and nonobese without evidence of hernia. No palpable masses Back No CVA tenderness Genito Urinary (exam poorly tolerated due to pain and therefore limited) Vulva/vagina: Normal external female genitalia.   No lesions. No discharge or bleeding.  Bladder/urethra:  No lesions  or masses, well supported bladder  Vagina: grossly normal without lesions, though there is moderate amber watery discharge emanating from the cervix/uterus.  Cervix: replaced by a friable crater like lesion, though not enlarged (2-3cm). Fleshy. Friable.  Uterus: Bulky, minimally mobile, apparent parametrial induration bilaterally to sidewalls.   Adnexa: no discretely appreciated masses. Rectal  Good tone, no masses no cul de sac nodularity though parametrial thickening is appreciated bilaterally Extremities  Very subtle 1+ edema on the right. Warm, well perfused.    Thereasa Solo, MD  09/19/2017, 3:39 PM

## 2017-09-19 NOTE — Patient Instructions (Addendum)
Dr Denman George is concerned that you may have cervical cancer that has spread to lymph nodes and the abdomen. She is planning to biopsy the cervix under anesthesia and obtain biopsies of the groin lymph nodes to confirm if there is a cancer and what type. She will then order additional appropriate imaging to determine the type of cancer and what is the most appropriate therapy.  She has prescribed pain medication to assist with your pelvic pain. Please take miralax daily (or twice daily) to keep stools regular while using this.  Plan to have an exam under anesthesia with cervical biopsies at the [facility] on September 25, 2017.  You will receive a phone call from the pre-surgical RN to discuss instructions.  Please call for any questions or concerns.

## 2017-09-19 NOTE — Progress Notes (Signed)
Consult Note: Gyn-Onc  Consult was requested by Dr. Octaviano Glow and Dr Lauretta Chester for the evaluation of Rebecca [name] 57 y.o. female  CC:  Chief Complaint  Patient presents with  . Postmenopausal bleeding  . Adnexal mass right  . Elevated CA-125    Assessment/Plan:  Ms. Rebecca [name]  is a 57 y.o.  year old with exam findings concerning for metastatic cervical versus uterine cancer.  The patient does not tolerate an examination due to extreme pain.  The cervix is palpably grossly abnormal with bilateral parametrial extension.  Therefore I am recommending an examination under anesthesia with cervical biopsies and possible endometrial biopsy or D&C to establish histologic diagnosis.  Additionally I am recommending biopsy of the palpably enlarged inguinal lymph nodes to define if this is a stage IV process.  If malignancy is confirmed on either of the specimens we will obtain PET imaging to better delineate the extent of her disease.  Her CT scan is suggestive of disease within the peritoneal cavity with possibly omental and mesenteric nodularity.  I discussed with the patient that while he do not presently have a confirmed diagnosis of malignancy, and given my exam findings and my evaluation of the CT scan and elevated Ca1 25, I am very concerned that she has an advanced gynecologic malignancy which is not amenable to surgical resection.  Treatment intervention will likely involve either primary chemoradiation versus chemotherapy depending upon the malignant histologic cell type.  Her pelvic pain is severe therefore I have prescribed oxycodone.    HPI: Rebecca [name] is a very pleasant 56 year old P0 who was seen in consultation at the request of Dr. Ernestina Patches for postmenopausal bleeding, discharge, pelvic pain, elevated Ca1 25, and abnormal CT scan.  The patient reports a two-month history of vaginal bleeding that began in February 2019.  It was not heavy bleeding and requiring a panty  liner. She was then evaluated with a transvaginal ultrasound on August 29, 2017.  This revealed an anteverted uterus measuring 11 x 7.2 x 7.6 cm with 2 3-4 cm fibroids.  The endometrium was seen with fluid and unable to visualize well during to the multiple myomas.  Bilateral ovaries were not seen.  However in the right adnexa solid cystic mass measuring 4.7 cm which was avascular was seen is unclear if this was an ovary or uterine myoma.  There is no free fluid in the cul-de-sac.  She then developed rapid onset unilateral right lower extremity edema that was significant and she was seen by an urgent care for evaluation and then there was a long ED.  A CT scan of the abdomen and pelvis on 09/15/17  were performed to evaluate for potential cause for right leg pain and swelling.  This revealed no evidence of venous occlusive disease.  However there were prominent lymph nodes in the groin regions bilaterally measuring up to 1.3 cm.  Within the abdomen there is a small amount of fluid around the liver.  There is possible nodular infiltration in the mesentery and omentum.  The uterus was prominent and measured 7.8 x 10.3 cm with multiple fibroids.  The endometrium was difficult to delineate.  The ovaries were not clearly identified separate from the enlarged uterus.    Ca1 25 was drawn on August 30, 2017 and was elevated at 97.3.  CEA was normal.  The patient reports that her last Pap smear was approximately 5 years ago and was normal.  She denies ever having a prior abnormal Pap smear.  She has had 2 first trimester miscarriages but no full-term pregnancies.  She is otherwise healthy with respect to medical comorbidities with the exception of hypertension.  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  Current Meds:  Outpatient Encounter Medications as of 09/19/2017  Medication Sig  . lisinopril-hydrochlorothiazide (PRINZIDE,ZESTORETIC) 20-12.5 MG tablet Take 1 tablet by mouth daily.  . Multiple Vitamin  (MULTIVITAMIN) tablet Take 1 tablet by mouth daily.  Marland Kitchen oxyCODONE (OXY IR/ROXICODONE) 5 MG immediate release tablet Take 1 tablet (5 mg total) by mouth every 4 (four) hours as needed for up to 7 days for severe pain.  . [DISCONTINUED] lisinopril (PRINIVIL,ZESTRIL) 20 MG tablet Take by mouth daily.   No facility-administered encounter medications on file as of 09/19/2017.     Allergy: No Known Allergies  Social Hx:   Social History   Socioeconomic History  . Marital status: Single    Spouse name: Not on file  . Number of children: Not on file  . Years of education: Not on file  . Highest education level: Not on file  Occupational History  . Not on file  Social Needs  . Financial resource strain: Not on file  . Food insecurity:    Worry: Not on file    Inability: Not on file  . Transportation needs:    Medical: Not on file    Non-medical: Not on file  Tobacco Use  . Smoking status: Never Smoker  . Smokeless tobacco: Never Used  Substance and Sexual Activity  . Alcohol use: Never    Frequency: Never  . Drug use: Never  . Sexual activity: Not Currently    Birth control/protection: Post-menopausal  Lifestyle  . Physical activity:    Days per week: Not on file    Minutes per session: Not on file  . Stress: Not on file  Relationships  . Social connections:    Talks on phone: Not on file    Gets together: Not on file    Attends religious service: Not on file    Active member of club or organization: Not on file    Attends meetings of clubs or organizations: Not on file    Relationship status: Not on file  . Intimate partner violence:    Fear of current or ex partner: Not on file    Emotionally abused: Not on file    Physically abused: Not on file    Forced sexual activity: Not on file  Other Topics Concern  . Not on file  Social History Narrative  . Not on file    Past Surgical Hx:  Past Surgical History:  Procedure Laterality Date  . DILATION AND CURETTAGE OF  UTERUS  2000   SAB    Past Medical Hx:  Past Medical History:  Diagnosis Date  . Fibroids   . Hypertension     Past Gynecological History:  G0P0020 No LMP recorded. Patient is postmenopausal.  Family Hx:  Family History  Problem Relation Age of Onset  . Hypertension Mother   . Breast cancer Maternal Aunt 50    Review of Systems:  Constitutional  Feels well,    ENT Normal appearing ears and nares bilaterally Skin/Breast  No rash, sores, jaundice, itching, dryness Cardiovascular  No chest pain, shortness of breath, or edema  Pulmonary  No cough or wheeze.  Gastro Intestinal  No nausea, vomitting, or diarrhoea. No bright red blood per rectum, no abdominal pain, change in bowel movement, or constipation.  Genito Urinary  No frequency, urgency, dysuria, + bleeding and discharge + pelvic pain Musculo Skeletal  + right lower extremity edema - fluctuates Neurologic  No weakness, numbness, change in gait,  Psychology  No depression, anxiety, insomnia.   Vitals:  Blood pressure (!) 146/75, pulse (!) 118, temperature 98.3 F (36.8 C), temperature source Oral, resp. rate 20, height 5\' 7"  (1.702 m), weight 185 lb 4.8 oz (84.1 kg), SpO2 100 %.  Physical Exam: WD in NAD Neck  Supple NROM, without any enlargements.  Lymph Node Survey + bilateral palpable inguinal lymphadenopathy Cardiovascular  Pulse normal rate, regularity and rhythm. S1 and S2 normal.  Lungs  Clear to auscultation bilateraly, without wheezes/crackles/rhonchi. Good air movement.  Skin  No rash/lesions/breakdown  Psychiatry  Alert and oriented to person, place, and time  Abdomen  Normoactive bowel sounds, abdomen soft, non-tender and nonobese without evidence of hernia. No palpable masses Back No CVA tenderness Genito Urinary (exam poorly tolerated due to pain and therefore limited) Vulva/vagina: Normal external female genitalia.   No lesions. No discharge or bleeding.  Bladder/urethra:  No lesions  or masses, well supported bladder  Vagina: grossly normal without lesions, though there is moderate amber watery discharge emanating from the cervix/uterus.  Cervix: replaced by a friable crater like lesion, though not enlarged (2-3cm). Fleshy. Friable.  Uterus: Bulky, minimally mobile, apparent parametrial induration bilaterally to sidewalls.   Adnexa: no discretely appreciated masses. Rectal  Good tone, no masses no cul de sac nodularity though parametrial thickening is appreciated bilaterally Extremities  Very subtle 1+ edema on the right. Warm, well perfused.    Thereasa Solo, MD  09/19/2017, 3:39 PM

## 2017-09-20 ENCOUNTER — Other Ambulatory Visit: Payer: Self-pay

## 2017-09-20 ENCOUNTER — Encounter (HOSPITAL_BASED_OUTPATIENT_CLINIC_OR_DEPARTMENT_OTHER): Payer: Self-pay

## 2017-09-20 NOTE — Progress Notes (Signed)
Spoke with:  Rebecca [name] NPO:  After Midnight, no gum, candy, or mints   Arrival time:  0900AM Labs: Istat 8 AM medications: None Pre op orders: Yes  Ride home:  Rebecca [name] (mother)

## 2017-09-25 ENCOUNTER — Other Ambulatory Visit: Payer: Self-pay | Admitting: Gynecologic Oncology

## 2017-09-25 ENCOUNTER — Ambulatory Visit (HOSPITAL_BASED_OUTPATIENT_CLINIC_OR_DEPARTMENT_OTHER): Payer: 59 | Admitting: Anesthesiology

## 2017-09-25 ENCOUNTER — Ambulatory Visit (HOSPITAL_BASED_OUTPATIENT_CLINIC_OR_DEPARTMENT_OTHER)
Admission: RE | Admit: 2017-09-25 | Discharge: 2017-09-25 | Disposition: A | Discharge status: Home / Self Care | Payer: 59 | Source: Ambulatory Visit | Attending: Gynecologic Oncology | Admitting: Gynecologic Oncology

## 2017-09-25 ENCOUNTER — Other Ambulatory Visit: Payer: Self-pay

## 2017-09-25 ENCOUNTER — Encounter (HOSPITAL_BASED_OUTPATIENT_CLINIC_OR_DEPARTMENT_OTHER): Payer: Self-pay

## 2017-09-25 ENCOUNTER — Encounter (HOSPITAL_BASED_OUTPATIENT_CLINIC_OR_DEPARTMENT_OTHER)
Admission: RE | Disposition: A | Discharge status: Home / Self Care | Payer: Self-pay | Source: Ambulatory Visit | Attending: Gynecologic Oncology

## 2017-09-25 DIAGNOSIS — I10 Essential (primary) hypertension: Secondary | ICD-10-CM | POA: Insufficient documentation

## 2017-09-25 DIAGNOSIS — C539 Malignant neoplasm of cervix uteri, unspecified: Secondary | ICD-10-CM | POA: Insufficient documentation

## 2017-09-25 DIAGNOSIS — N950 Postmenopausal bleeding: Secondary | ICD-10-CM | POA: Diagnosis present

## 2017-09-25 DIAGNOSIS — C530 Malignant neoplasm of endocervix: Secondary | ICD-10-CM

## 2017-09-25 DIAGNOSIS — N888 Other specified noninflammatory disorders of cervix uteri: Secondary | ICD-10-CM

## 2017-09-25 HISTORY — DX: Other ascites: R18.8

## 2017-09-25 HISTORY — DX: Dyspnea, unspecified: R06.00

## 2017-09-25 HISTORY — DX: Atherosclerosis of aorta: I70.0

## 2017-09-25 HISTORY — DX: Localized edema: R60.0

## 2017-09-25 HISTORY — DX: Obesity, unspecified: E66.9

## 2017-09-25 HISTORY — DX: Hyperlipidemia, unspecified: E78.5

## 2017-09-25 HISTORY — DX: Pain in right leg: M79.604

## 2017-09-25 HISTORY — DX: Constipation, unspecified: K59.00

## 2017-09-25 HISTORY — DX: Fatty (change of) liver, not elsewhere classified: K76.0

## 2017-09-25 LAB — POCT I-STAT, CHEM 8
BUN: 17 mg/dL (ref 6–20)
CALCIUM ION: 1.27 mmol/L (ref 1.15–1.40)
Chloride: 98 mmol/L — ABNORMAL LOW (ref 101–111)
Creatinine, Ser: 0.8 mg/dL (ref 0.44–1.00)
GLUCOSE: 110 mg/dL — AB (ref 65–99)
HCT: 31 % — ABNORMAL LOW (ref 36.0–46.0)
Hemoglobin: 10.5 g/dL — ABNORMAL LOW (ref 12.0–15.0)
Potassium: 3.7 mmol/L (ref 3.5–5.1)
SODIUM: 139 mmol/L (ref 135–145)
TCO2: 27 mmol/L (ref 22–32)

## 2017-09-25 SURGERY — EXAM UNDER ANESTHESIA
Anesthesia: General

## 2017-09-25 MED ORDER — ONDANSETRON HCL 4 MG/2ML IJ SOLN
INTRAMUSCULAR | Status: AC
  Filled 2017-09-25: qty 2

## 2017-09-25 MED ORDER — DEXAMETHASONE SODIUM PHOSPHATE 10 MG/ML IJ SOLN
INTRAMUSCULAR | Status: AC
  Filled 2017-09-25: qty 1

## 2017-09-25 MED ORDER — FENTANYL CITRATE (PF) 100 MCG/2ML IJ SOLN
25.0000 ug | INTRAMUSCULAR | Status: DC | PRN
  Filled 2017-09-25: qty 1

## 2017-09-25 MED ORDER — ONDANSETRON HCL 4 MG/2ML IJ SOLN
4.0000 mg | Freq: Once | INTRAMUSCULAR | Status: DC | PRN
  Filled 2017-09-25: qty 2

## 2017-09-25 MED ORDER — LIDOCAINE 2% (20 MG/ML) 5 ML SYRINGE
INTRAMUSCULAR | Status: AC
  Filled 2017-09-25: qty 5

## 2017-09-25 MED ORDER — LACTATED RINGERS IV SOLN
INTRAVENOUS | Status: DC
  Administered 2017-09-25: 09:00:00 via INTRAVENOUS
  Filled 2017-09-25: qty 1000

## 2017-09-25 MED ORDER — FENTANYL CITRATE (PF) 100 MCG/2ML IJ SOLN
INTRAMUSCULAR | Status: DC | PRN
  Administered 2017-09-25 (×2): 50 ug via INTRAVENOUS

## 2017-09-25 MED ORDER — MIDAZOLAM HCL 2 MG/2ML IJ SOLN
INTRAMUSCULAR | Status: AC
  Filled 2017-09-25: qty 2

## 2017-09-25 MED ORDER — ONDANSETRON HCL 4 MG/2ML IJ SOLN
INTRAMUSCULAR | Status: DC | PRN
  Administered 2017-09-25: 4 mg via INTRAVENOUS

## 2017-09-25 MED ORDER — ACETAMINOPHEN 160 MG/5ML PO SOLN
325.0000 mg | ORAL | Status: DC | PRN
  Filled 2017-09-25: qty 20.3

## 2017-09-25 MED ORDER — OXYCODONE HCL 5 MG/5ML PO SOLN
5.0000 mg | Freq: Once | ORAL | Status: DC | PRN
  Filled 2017-09-25: qty 5

## 2017-09-25 MED ORDER — KETOROLAC TROMETHAMINE 30 MG/ML IJ SOLN
INTRAMUSCULAR | Status: DC | PRN
  Administered 2017-09-25: 30 mg via INTRAVENOUS

## 2017-09-25 MED ORDER — MEPERIDINE HCL 25 MG/ML IJ SOLN
6.2500 mg | INTRAMUSCULAR | Status: DC | PRN
  Filled 2017-09-25: qty 1

## 2017-09-25 MED ORDER — KETOROLAC TROMETHAMINE 30 MG/ML IJ SOLN
INTRAMUSCULAR | Status: AC
  Filled 2017-09-25: qty 1

## 2017-09-25 MED ORDER — PROPOFOL 10 MG/ML IV BOLUS
INTRAVENOUS | Status: AC
  Filled 2017-09-25: qty 40

## 2017-09-25 MED ORDER — LIDOCAINE 2% (20 MG/ML) 5 ML SYRINGE
INTRAMUSCULAR | Status: DC | PRN
  Administered 2017-09-25: 100 mg via INTRAVENOUS

## 2017-09-25 MED ORDER — OXYCODONE HCL 5 MG PO TABS
5.0000 mg | ORAL_TABLET | Freq: Once | ORAL | Status: DC | PRN
  Filled 2017-09-25: qty 1

## 2017-09-25 MED ORDER — PROPOFOL 10 MG/ML IV BOLUS
INTRAVENOUS | Status: DC | PRN
  Administered 2017-09-25: 180 mg via INTRAVENOUS

## 2017-09-25 MED ORDER — FENTANYL CITRATE (PF) 100 MCG/2ML IJ SOLN
INTRAMUSCULAR | Status: AC
  Filled 2017-09-25: qty 2

## 2017-09-25 MED ORDER — ACETAMINOPHEN 325 MG PO TABS
325.0000 mg | ORAL_TABLET | ORAL | Status: DC | PRN
  Filled 2017-09-25: qty 2

## 2017-09-25 MED ORDER — DEXAMETHASONE SODIUM PHOSPHATE 10 MG/ML IJ SOLN
INTRAMUSCULAR | Status: DC | PRN
  Administered 2017-09-25: 10 mg via INTRAVENOUS

## 2017-09-25 MED ORDER — MIDAZOLAM HCL 5 MG/5ML IJ SOLN
INTRAMUSCULAR | Status: DC | PRN
  Administered 2017-09-25: 2 mg via INTRAVENOUS

## 2017-09-25 SURGICAL SUPPLY — 40 items
APPLICATOR COTTON TIP 6IN STRL (MISCELLANEOUS) IMPLANT
BLADE EXTENDED COATED 6.5IN (ELECTRODE) IMPLANT
BLADE SURG 11 STRL SS (BLADE) ×2 IMPLANT
BLADE SURG 15 STRL LF DISP TIS (BLADE) IMPLANT
BLADE SURG 15 STRL SS (BLADE)
CANISTER SUCT 3000ML PPV (MISCELLANEOUS) ×2 IMPLANT
CATH FOLEY 2WAY SLVR  5CC 14FR (CATHETERS)
CATH FOLEY 2WAY SLVR 5CC 14FR (CATHETERS) IMPLANT
CATH ROBINSON RED A/P 14FR (CATHETERS) IMPLANT
COVER BACK TABLE 60X90IN (DRAPES) ×2 IMPLANT
DRAPE LG THREE QUARTER DISP (DRAPES) ×4 IMPLANT
DRAPE UNDERBUTTOCKS STRL (DRAPE) ×2 IMPLANT
GAUZE SPONGE 4X4 12PLY STRL (GAUZE/BANDAGES/DRESSINGS) IMPLANT
GAUZE SPONGE 4X4 16PLY XRAY LF (GAUZE/BANDAGES/DRESSINGS) IMPLANT
GLOVE BIO SURGEON STRL SZ 6 (GLOVE) ×4 IMPLANT
KIT TURNOVER CYSTO (KITS) ×2 IMPLANT
LEGGING LITHOTOMY PAIR STRL (DRAPES) ×2 IMPLANT
NEEDLE HYPO 25X1 1.5 SAFETY (NEEDLE) ×2 IMPLANT
NEEDLE SPNL 22GX3.5 QUINCKE BK (NEEDLE) IMPLANT
NS IRRIG 500ML POUR BTL (IV SOLUTION) ×2 IMPLANT
PACK BASIN DAY SURGERY FS (CUSTOM PROCEDURE TRAY) ×2 IMPLANT
PAD OB MATERNITY 4.3X12.25 (PERSONAL CARE ITEMS) ×2 IMPLANT
PENCIL BUTTON HOLSTER BLD 10FT (ELECTRODE) ×2 IMPLANT
SUT VIC AB 2-0 CT2 27 (SUTURE) IMPLANT
SUT VIC AB 2-0 SH 27 (SUTURE)
SUT VIC AB 2-0 SH 27X BRD (SUTURE) IMPLANT
SUT VIC AB 3-0 PS2 18 (SUTURE)
SUT VIC AB 3-0 PS2 18XBRD (SUTURE) IMPLANT
SUT VICRYL 2 0 18  UND BR (SUTURE)
SUT VICRYL 2 0 18 UND BR (SUTURE) IMPLANT
SWAB OB GYN 8IN STERILE 2PK (MISCELLANEOUS) IMPLANT
SYR BULB IRRIGATION 50ML (SYRINGE) ×2 IMPLANT
SYR CONTROL 10ML LL (SYRINGE) ×2 IMPLANT
TOWEL OR 17X24 6PK STRL BLUE (TOWEL DISPOSABLE) ×2 IMPLANT
TRAY DSU PREP LF (CUSTOM PROCEDURE TRAY) ×2 IMPLANT
TUBE CONNECTING 12X1/4 (SUCTIONS) ×2 IMPLANT
UNDERPAD 30X30 (UNDERPADS AND DIAPERS) ×2 IMPLANT
VACUUM HOSE/TUBING 7/8INX6FT (MISCELLANEOUS) IMPLANT
WATER STERILE IRR 500ML POUR (IV SOLUTION) ×2 IMPLANT
YANKAUER SUCT BULB TIP NO VENT (SUCTIONS) ×2 IMPLANT

## 2017-09-25 NOTE — Op Note (Signed)
PATIENT: Raysa Bosak DATE: 09/25/17   Preop Diagnosis: cervical mass, postmenopausal bleeding, elevated CA 125  Postoperative Diagnosis: locally advanced cervical cancer.  Surgery: examination under anesthesia with cervical biopsies  Surgeons:  Donaciano Eva, MD Assistant: none  Anesthesia: General with LMA  Estimated blood loss: <20 ml  IVF:  254ml   Urine output: 20 ml   Complications: None   Pathology: cervical biopsies   Operative findings: narrow vagina. Cervix replaced circumferentially with fleshy mass which, on rectovaginal exam extends completely to the right sidewall with uterosacral nodularity. Friable tissue emanating from cervix. Vagina grossly normal. Uterus, globularly enlarged and minimally mobile.   Procedure: The patient was identified in the preoperative holding area. Informed consent was signed on the chart. Patient was seen history was reviewed and exam was performed.   The patient was then taken to the operating room and placed in the supine position with SCD hose on. General anesthesia was then induced without difficulty. She was then placed in the dorsolithotomy position. The perineum was prepped with Betadine. The vagina was prepped with Betadine. The patient was then draped after the prep was dried. An in and out catheterization to empty the bladder was performed under sterile conditions.  Timeout was performed the patient, procedure, antibiotic, allergy, and length of procedure.   The speculum was placed in the posterior vagina. The cervical tumor was visualized. It was biopsied 3 times with a kevorkian biopsy forcep. Large pieces of tumor were produced. .  The bovie was used at 56 coag to create hemostasis at the surgical bed.  All instrument, suture, laparotomy, Ray-Tec, and needle counts were correct x2. The patient tolerated the procedure well and was taken recovery room in stable condition. This is Everitt Amber dictating an  operative note on Darlene Brozowski .

## 2017-09-25 NOTE — Discharge Instructions (Signed)
No advil, aleve, motrin, ibuprofen until 7 pm today     Cervical Biopsy, Care After ACTIVITY  Rest as much as possible the first two days after discharge.  You do not have weight restrictions on lifting  Avoid strenuous working out such as running or lifting weights for 48-72 hours  You can climb stairs and drive a car NUTRITION  You may resume your normal diet.  Drink 6 to 8 glasses of fluids a day.  Eat a healthy, balanced diet including portions of food from the meat (protein), milk, fruit, vegetable, and bread groups.  Your caregiver may recommend you take a multivitamin with iron.  ELIMINATION   If constipation occurs, drink more liquids, and add more fruits, vegetables, and bran to your diet. You may take a mild laxative, such as Milk of Magnesia, Metamucil, or a stool softener such as Colace, with permission from your caregiver.  HYGIENE  You may shower and wash your hair.  Avoid tub baths for 4 weeks  Do not add any bath oils or chemicals to your bath water, after you have permission to take baths.  Avoid placing anything in the vagina for 4 weeks.  It is normal to pass a brown-flecked discharge from the vagina for several weeks after your procedure.  HOME CARE INSTRUCTIONS   Take your temperature twice a day and record it, especially if you feel feverish or have chills.  Follow your caregiver's instructions about medicines, activity, and follow-up appointments after surgery.  Do not drink alcohol while taking pain medicine.  You may take over-the-counter medicine for pain, recommended by your caregiver.  If your pain is not relieved with medicine, call your caregiver.  Do not take aspirin because it can cause bleeding.  Do not douche or use tampons (use a nonperfumed sanitary pad).  Do not have sexual intercourse for 6 weeks postoperatively. Hugging, kissing, and playful sexual activity is fine with your caregiver's permission.  Take showers  instead of baths, until your caregiver gives you permission to take baths.  You may take a mild medicine for constipation, recommended by your caregiver. Bran foods and drinking a lot of fluids will help with constipation.  Make sure your family understands everything about your operation and recovery.  SEEK MEDICAL CARE IF:    You notice a foul smell coming from the vagina.  You have painful or bloody urination.  You develop nausea and vomiting.  You develop diarrhea.  You develop a rash.  You have a reaction or allergy from the medicine.  You feel dizzy or light-headed.  You need stronger pain medicine.   SEEK IMMEDIATE MEDICAL CARE IF:   You develop a temperature of 102 F (38.9 C) or higher.  You pass out.  You develop leg or chest pain.  You develop abdominal pain.  You develop shortness of breath.  You bleed heavier than a period (soaking through 2 or more pads per hour for 2 hours in a row).  You see pus in the wound area.  MAKE SURE YOU:   Understand these instructions.  Will watch your condition.  Will get help right away if you are not doing well or get worse. Document Released: 01/11/2004 Document Revised: 10/13/2013 Document Reviewed: 04/30/2009 [facility] Patient Information 2015 [place], [place]. This information is not intended to replace advice given to you by your health care provider. Make sure you discuss any questions you have with your health care provider.   Post Anesthesia Home Care Instructions  Activity: Get plenty of rest for the remainder of the day. A responsible individual must stay with you for 24 hours following the procedure.  For the next 24 hours, DO NOT: -Drive a car -Paediatric nurse -Drink alcoholic beverages -Take any medication unless instructed by your physician -Make any legal decisions or sign important papers.  Meals: Start with liquid foods such as gelatin or soup. Progress to regular foods as tolerated. Avoid  greasy, spicy, heavy foods. If nausea and/or vomiting occur, drink only clear liquids until the nausea and/or vomiting subsides. Call your physician if vomiting continues.  Special Instructions/Symptoms: Your throat may feel dry or sore from the anesthesia or the breathing tube placed in your throat during surgery. If this causes discomfort, gargle with warm salt water. The discomfort should disappear within 24 hours.  If you had a scopolamine patch placed behind your ear for the management of post- operative nausea and/or vomiting:  1. The medication in the patch is effective for 72 hours, after which it should be removed.  Wrap patch in a tissue and discard in the trash. Wash hands thoroughly with soap and water. 2. You may remove the patch earlier than 72 hours if you experience unpleasant side effects which may include dry mouth, dizziness or visual disturbances. 3. Avoid touching the patch. Wash your hands with soap and water after contact with the patch.

## 2017-09-25 NOTE — Anesthesia Postprocedure Evaluation (Signed)
Anesthesia Post Note  Patient: Rebecca [name]  Procedure(s) Performed: EXAM UNDER ANESTHESIA WITH CERVICAL BIOPSY (N/A )     Patient location during evaluation: PACU Anesthesia Type: General Level of consciousness: awake and alert Pain management: pain level controlled Vital Signs Assessment: post-procedure vital signs reviewed and stable Respiratory status: spontaneous breathing, nonlabored ventilation, respiratory function stable and patient connected to nasal cannula oxygen Cardiovascular status: blood pressure returned to baseline and stable Postop Assessment: no apparent nausea or vomiting Anesthetic complications: no    Last Vitals:  Vitals:   09/25/17 1319 09/25/17 1330  BP: 102/74 118/67  Pulse: (!) 129 (!) 124  Resp: 20 (!) 23  Temp: 37.1 C   SpO2: 100%     Last Pain:  Vitals:   09/25/17 1330  TempSrc:   PainSc: 0-No pain                 Margeart Allender

## 2017-09-25 NOTE — Transfer of Care (Signed)
Immediate Anesthesia Transfer of Care Note  Patient: Rebecca [name]  Procedure(s) Performed: EXAM UNDER ANESTHESIA WITH CERVICAL BIOPSY (N/A )  Patient Location: PACU  Anesthesia Type:General  Level of Consciousness: sedated and responds to stimulation  Airway & Oxygen Therapy: Patient Spontanous Breathing and Patient connected to nasal cannula oxygen  Post-op Assessment: Report given to RN  Post vital signs: Reviewed and stable  Last Vitals: 102/74, 122, 24, 100%, 98.8 Vitals Value Taken Time  BP    Temp    Pulse 129 09/25/2017  1:19 PM  Resp 20 09/25/2017  1:19 PM  SpO2 100 % 09/25/2017  1:19 PM  Vitals shown include unvalidated device data.  Last Pain:  Vitals:   09/25/17 0847  TempSrc:   PainSc: 0-No pain         Complications: No apparent anesthesia complications

## 2017-09-25 NOTE — Anesthesia Preprocedure Evaluation (Signed)
Anesthesia Evaluation  Patient identified by MRN, date of birth, ID band Patient awake    Reviewed: Allergy & Precautions, NPO status , Patient's Chart, lab work & pertinent test results  Airway Mallampati: II  TM Distance: >3 FB Neck ROM: Full    Dental no notable dental hx.    Pulmonary neg pulmonary ROS,    Pulmonary exam normal breath sounds clear to auscultation       Cardiovascular hypertension, negative cardio ROS Normal cardiovascular exam Rhythm:Regular Rate:Normal     Neuro/Psych negative neurological ROS  negative psych ROS   GI/Hepatic negative GI ROS, Neg liver ROS,   Endo/Other  negative endocrine ROS  Renal/GU negative Renal ROS  negative genitourinary   Musculoskeletal negative musculoskeletal ROS (+)   Abdominal   Peds negative pediatric ROS (+)  Hematology negative hematology ROS (+)   Anesthesia Other Findings   Reproductive/Obstetrics negative OB ROS                             Anesthesia Physical Anesthesia Plan  ASA: II  Anesthesia Plan: General   Post-op Pain Management:    Induction: Intravenous  PONV Risk Score and Plan: 3 and Ondansetron, Treatment may vary due to age or medical condition, Dexamethasone and Midazolam  Airway Management Planned: LMA  Additional Equipment:   Intra-op Plan:   Post-operative Plan:   Informed Consent: I have reviewed the patients History and Physical, chart, labs and discussed the procedure including the risks, benefits and alternatives for the proposed anesthesia with the patient or authorized representative who has indicated his/her understanding and acceptance.     Plan Discussed with: CRNA, Surgeon and Anesthesiologist  Anesthesia Plan Comments: ( )       Anesthesia Quick Evaluation

## 2017-09-25 NOTE — Anesthesia Procedure Notes (Signed)
Procedure Name: LMA Insertion Date/Time: 09/25/2017 12:48 PM Performed by: Bonney Aid, CRNA Pre-anesthesia Checklist: Patient identified, Emergency Drugs available, Suction available and Patient being monitored Patient Re-evaluated:Patient Re-evaluated prior to induction Oxygen Delivery Method: Circle system utilized Preoxygenation: Pre-oxygenation with 100% oxygen Induction Type: IV induction Ventilation: Mask ventilation without difficulty LMA: LMA inserted LMA Size: 4.0 Number of attempts: 1 Airway Equipment and Method: Bite block Placement Confirmation: positive ETCO2 Tube secured with: Tape Dental Injury: Teeth and Oropharynx as per pre-operative assessment

## 2017-09-25 NOTE — Interval H&P Note (Signed)
History and Physical Interval Note:  09/25/2017 12:39 PM  Rebecca [name]  has presented today for surgery, with the diagnosis of CERVICAL MASS  The various methods of treatment have been discussed with the patient and family. After consideration of risks, benefits and other options for treatment, the patient has consented to  Procedure(s): EXAM UNDER ANESTHESIA WITH CERVICAL BIOPSY (N/A) as a surgical intervention .  The patient's history has been reviewed, patient examined, no change in status, stable for surgery.  I have reviewed the patient's chart and labs.  Questions were answered to the patient's satisfaction.     Thereasa Solo

## 2017-09-26 ENCOUNTER — Encounter: Payer: Self-pay | Admitting: Radiation Oncology

## 2017-09-27 ENCOUNTER — Telehealth: Payer: Self-pay | Admitting: *Deleted

## 2017-09-27 NOTE — Telephone Encounter (Signed)
Called and scheduled the patient for PET scan for April 24th at 8am, arrive at 7:30am.  Called and gave the patient the appt information

## 2017-10-01 ENCOUNTER — Telehealth: Payer: Self-pay | Admitting: Gynecologic Oncology

## 2017-10-01 NOTE — Telephone Encounter (Signed)
Left message to call back.  Patient has cervical cancer confirmed on biopsy. Recommend PET scan and biopsy of inguinal nodes to better characterize Rebecca extent of disease.  Then recommend primary chemoradiation followed by additional systemic therapy (chemotherapy) if disease is identified on PET or biopsy outside of Rebecca cervix (in Rebecca inguinal nodes, adnexa, chest or upper abdomen).   She will require consultation with Dr's Sondra Come and Alvy Bimler.   Thereasa Solo, MD

## 2017-10-02 ENCOUNTER — Telehealth: Payer: Self-pay | Admitting: Gynecologic Oncology

## 2017-10-02 NOTE — Telephone Encounter (Signed)
Returned call to patient.  Left message asking her to please call the office. 

## 2017-10-03 ENCOUNTER — Ambulatory Visit (HOSPITAL_COMMUNITY): Payer: 59

## 2017-10-03 ENCOUNTER — Emergency Department (HOSPITAL_COMMUNITY): Payer: 59

## 2017-10-03 ENCOUNTER — Encounter (HOSPITAL_COMMUNITY): Payer: Self-pay | Admitting: Emergency Medicine

## 2017-10-03 ENCOUNTER — Other Ambulatory Visit: Payer: Self-pay

## 2017-10-03 ENCOUNTER — Inpatient Hospital Stay (HOSPITAL_COMMUNITY)
Admission: EM | Admit: 2017-10-03 | Discharge: 2017-10-10 | DRG: 988 | Disposition: E | Payer: 59 | Attending: Internal Medicine | Admitting: Internal Medicine

## 2017-10-03 DIAGNOSIS — Z803 Family history of malignant neoplasm of breast: Secondary | ICD-10-CM

## 2017-10-03 DIAGNOSIS — I469 Cardiac arrest, cause unspecified: Secondary | ICD-10-CM | POA: Diagnosis not present

## 2017-10-03 DIAGNOSIS — C530 Malignant neoplasm of endocervix: Secondary | ICD-10-CM | POA: Diagnosis present

## 2017-10-03 DIAGNOSIS — C539 Malignant neoplasm of cervix uteri, unspecified: Secondary | ICD-10-CM

## 2017-10-03 DIAGNOSIS — I10 Essential (primary) hypertension: Secondary | ICD-10-CM | POA: Diagnosis present

## 2017-10-03 DIAGNOSIS — E875 Hyperkalemia: Secondary | ICD-10-CM | POA: Diagnosis present

## 2017-10-03 DIAGNOSIS — E669 Obesity, unspecified: Secondary | ICD-10-CM | POA: Diagnosis present

## 2017-10-03 DIAGNOSIS — K5641 Fecal impaction: Secondary | ICD-10-CM

## 2017-10-03 DIAGNOSIS — C786 Secondary malignant neoplasm of retroperitoneum and peritoneum: Secondary | ICD-10-CM | POA: Diagnosis not present

## 2017-10-03 DIAGNOSIS — E785 Hyperlipidemia, unspecified: Secondary | ICD-10-CM | POA: Diagnosis present

## 2017-10-03 DIAGNOSIS — K56609 Unspecified intestinal obstruction, unspecified as to partial versus complete obstruction: Secondary | ICD-10-CM

## 2017-10-03 DIAGNOSIS — K56600 Partial intestinal obstruction, unspecified as to cause: Secondary | ICD-10-CM

## 2017-10-03 DIAGNOSIS — R188 Other ascites: Secondary | ICD-10-CM | POA: Diagnosis present

## 2017-10-03 DIAGNOSIS — D630 Anemia in neoplastic disease: Secondary | ICD-10-CM | POA: Diagnosis present

## 2017-10-03 DIAGNOSIS — N179 Acute kidney failure, unspecified: Secondary | ICD-10-CM | POA: Diagnosis not present

## 2017-10-03 DIAGNOSIS — I700 Atherosclerosis of aorta: Secondary | ICD-10-CM | POA: Diagnosis present

## 2017-10-03 DIAGNOSIS — E860 Dehydration: Secondary | ICD-10-CM | POA: Diagnosis present

## 2017-10-03 DIAGNOSIS — Z6829 Body mass index (BMI) 29.0-29.9, adult: Secondary | ICD-10-CM

## 2017-10-03 DIAGNOSIS — R102 Pelvic and perineal pain: Secondary | ICD-10-CM | POA: Diagnosis not present

## 2017-10-03 LAB — CBC
HEMATOCRIT: 28.4 % — AB (ref 36.0–46.0)
HEMOGLOBIN: 8.9 g/dL — AB (ref 12.0–15.0)
MCH: 26 pg (ref 26.0–34.0)
MCHC: 31.3 g/dL (ref 30.0–36.0)
MCV: 83 fL (ref 78.0–100.0)
Platelets: 318 10*3/uL (ref 150–400)
RBC: 3.42 MIL/uL — ABNORMAL LOW (ref 3.87–5.11)
RDW: 14.5 % (ref 11.5–15.5)
WBC: 13.9 10*3/uL — ABNORMAL HIGH (ref 4.0–10.5)

## 2017-10-03 LAB — COMPREHENSIVE METABOLIC PANEL
ALBUMIN: 2.7 g/dL — AB (ref 3.5–5.0)
ALT: 28 U/L (ref 14–54)
ANION GAP: 15 (ref 5–15)
AST: 37 U/L (ref 15–41)
Alkaline Phosphatase: 53 U/L (ref 38–126)
BILIRUBIN TOTAL: 1 mg/dL (ref 0.3–1.2)
BUN: 69 mg/dL — ABNORMAL HIGH (ref 6–20)
CO2: 24 mmol/L (ref 22–32)
Calcium: 9.4 mg/dL (ref 8.9–10.3)
Chloride: 94 mmol/L — ABNORMAL LOW (ref 101–111)
Creatinine, Ser: 2.66 mg/dL — ABNORMAL HIGH (ref 0.44–1.00)
GFR calc Af Amer: 22 mL/min — ABNORMAL LOW (ref >60)
GFR calc non Af Amer: 19 mL/min — ABNORMAL LOW (ref >60)
GLUCOSE: 114 mg/dL — AB (ref 65–99)
POTASSIUM: 5.6 mmol/L — AB (ref 3.5–5.1)
SODIUM: 133 mmol/L — AB (ref 135–145)
TOTAL PROTEIN: 7.8 g/dL (ref 6.5–8.1)

## 2017-10-03 LAB — CBC WITH DIFFERENTIAL/PLATELET
BASOS PCT: 0 %
Basophils Absolute: 0 10*3/uL (ref 0.0–0.1)
Eosinophils Absolute: 0.1 10*3/uL (ref 0.0–0.7)
Eosinophils Relative: 1 %
HEMATOCRIT: 29 % — AB (ref 36.0–46.0)
Hemoglobin: 9 g/dL — ABNORMAL LOW (ref 12.0–15.0)
Lymphocytes Relative: 12 %
Lymphs Abs: 2 10*3/uL (ref 0.7–4.0)
MCH: 25.9 pg — AB (ref 26.0–34.0)
MCHC: 31 g/dL (ref 30.0–36.0)
MCV: 83.3 fL (ref 78.0–100.0)
MONO ABS: 0.8 10*3/uL (ref 0.1–1.0)
MONOS PCT: 5 %
NEUTROS ABS: 12.8 10*3/uL — AB (ref 1.7–7.7)
Neutrophils Relative %: 82 %
Platelets: 334 10*3/uL (ref 150–400)
RBC: 3.48 MIL/uL — ABNORMAL LOW (ref 3.87–5.11)
RDW: 15 % (ref 11.5–15.5)
WBC: 15.8 10*3/uL — ABNORMAL HIGH (ref 4.0–10.5)

## 2017-10-03 LAB — D-DIMER, QUANTITATIVE: D-Dimer, Quant: >20 ug/mL-FEU — ABNORMAL HIGH (ref 0.00–0.50)

## 2017-10-03 LAB — APTT: aPTT: 21 seconds — ABNORMAL LOW (ref 24–36)

## 2017-10-03 LAB — CREATININE, SERUM
Creatinine, Ser: 2.47 mg/dL — ABNORMAL HIGH (ref 0.44–1.00)
GFR calc Af Amer: 24 mL/min — ABNORMAL LOW (ref >60)
GFR, EST NON AFRICAN AMERICAN: 21 mL/min — AB (ref >60)

## 2017-10-03 LAB — PROTIME-INR
INR: 1.1
Prothrombin Time: 14.1 seconds (ref 11.4–15.2)

## 2017-10-03 MED ORDER — OXYCODONE HCL 5 MG PO TABS: 5.0000 mg | ORAL_TABLET | ORAL | Status: DC | PRN

## 2017-10-03 MED ORDER — LACTATED RINGERS IV BOLUS
1000.0000 mL | Freq: Once | INTRAVENOUS | Status: AC
  Administered 2017-10-03: 1000 mL via INTRAVENOUS

## 2017-10-03 MED ORDER — ACETAMINOPHEN 325 MG PO TABS: 650.0000 mg | ORAL_TABLET | ORAL | Status: DC | PRN

## 2017-10-03 MED ORDER — MORPHINE SULFATE (PF) 2 MG/ML IV SOLN
2.0000 mg | INTRAVENOUS | Status: DC | PRN
  Administered 2017-10-03 – 2017-10-05 (×14): 2 mg via INTRAVENOUS
  Filled 2017-10-03 (×14): qty 1

## 2017-10-03 MED ORDER — MORPHINE SULFATE (PF) 2 MG/ML IV SOLN
1.0000 mg | INTRAVENOUS | Status: DC | PRN
Start: 2017-10-03 — End: 2017-10-03
  Administered 2017-10-03: 1 mg via INTRAVENOUS
  Filled 2017-10-03: qty 1

## 2017-10-03 MED ORDER — FENTANYL CITRATE (PF) 100 MCG/2ML IJ SOLN
75.0000 ug | Freq: Once | INTRAMUSCULAR | Status: AC
  Administered 2017-10-03: 75 ug via INTRAVENOUS
  Filled 2017-10-03: qty 2

## 2017-10-03 MED ORDER — MORPHINE SULFATE (PF) 2 MG/ML IV SOLN
2.0000 mg | INTRAVENOUS | Status: AC
  Administered 2017-10-03: 2 mg via INTRAVENOUS

## 2017-10-03 MED ORDER — IOPAMIDOL (ISOVUE-300) INJECTION 61%
INTRAVENOUS | Status: AC
  Administered 2017-10-03: 30 mL via ORAL
  Filled 2017-10-03: qty 30

## 2017-10-03 MED ORDER — HALOPERIDOL LACTATE 5 MG/ML IJ SOLN: 5.0000 mg | Freq: Once | INTRAMUSCULAR | Status: DC

## 2017-10-03 MED ORDER — IOPAMIDOL (ISOVUE-300) INJECTION 61%
30.0000 mL | Freq: Once | INTRAVENOUS | Status: AC | PRN
  Administered 2017-10-03: 30 mL via ORAL

## 2017-10-03 MED ORDER — DEXTROSE-NACL 5-0.9 % IV SOLN
INTRAVENOUS | Status: DC
  Administered 2017-10-03: 16:00:00 via INTRAVENOUS
  Administered 2017-10-03: 1000 mL via INTRAVENOUS
  Administered 2017-10-04: 17:00:00 via INTRAVENOUS
  Administered 2017-10-04 – 2017-10-05 (×2): 1000 mL via INTRAVENOUS
  Administered 2017-10-05 – 2017-10-08 (×5): via INTRAVENOUS

## 2017-10-03 MED ORDER — MORPHINE SULFATE (PF) 2 MG/ML IV SOLN
INTRAVENOUS | Status: AC
  Filled 2017-10-03: qty 1

## 2017-10-03 MED ORDER — HEPARIN SODIUM (PORCINE) 5000 UNIT/ML IJ SOLN
5000.0000 [IU] | Freq: Three times a day (TID) | INTRAMUSCULAR | Status: DC
  Administered 2017-10-03 – 2017-10-07 (×10): 5000 [IU] via SUBCUTANEOUS
  Filled 2017-10-03 (×14): qty 1

## 2017-10-03 MED ORDER — ADULT MULTIVITAMIN W/MINERALS CH
1.0000 | ORAL_TABLET | Freq: Every day | ORAL | Status: DC
  Filled 2017-10-03: qty 1

## 2017-10-03 NOTE — ED Notes (Signed)
ED TO INPATIENT HANDOFF REPORT  Name/Age/Gender Rebecca [name] 57 y.o. female  Code Status    Code Status Orders  (From admission, onward)        Start     Ordered   09/26/2017 0715  Full code  Continuous     09/29/2017 0714    Code Status History    This patient has a current code status but no historical code status.      Home/SNF/Other Home  Chief Complaint pelvic pain  Level of Care/Admitting Diagnosis ED Disposition    ED Disposition Condition Comment   Admit  Hospital Area: [facility] [[id number]]  Level of Care: Med-Surg [16]  Diagnosis: SBO (small bowel obstruction) [facility]) [[id number]]  Admitting Physician: Velvet Bathe [[id number]]  Attending Physician: Velvet Bathe [[id number]]  PT Class (Do Not Modify): Observation [104]  PT Acc Code (Do Not Modify): Observation [10022]       Medical History Past Medical History:  Diagnosis Date  . Aortic atherosclerosis ([place])   . Ascites    Minimal  . Constipation   . Dyspnea   . Fatty liver   . Fibroids   . Hyperlipidemia   . Hypertension   . Lower leg edema   . Obese   . Right leg pain     Allergies No Known Allergies  IV Location/Drains/Wounds Patient Lines/Drains/Airways Status   Active Line/Drains/Airways    Name:   Placement date:   Placement time:   Site:   Days:   Peripheral IV 09/22/2017 Right Hand   10/06/2017    0822    Hand   less than 1   Incision (Closed) 09/25/17 Perineum   09/25/17    1209     8          Labs/Imaging Results for orders placed or performed during the hospital encounter of 09/17/2017 (from the past 48 hour(s))  CBC with Differential     Status: Abnormal   Collection Time: 09/20/2017  7:03 AM  Result Value Ref Range   WBC 15.8 (H) 4.0 - 10.5 K/uL   RBC 3.48 (L) 3.87 - 5.11 MIL/uL   Hemoglobin 9.0 (L) 12.0 - 15.0 g/dL   HCT 29.0 (L) 36.0 - 46.0 %   MCV 83.3 78.0 - 100.0 fL   MCH 25.9 (L) 26.0 - 34.0 pg   MCHC 31.0 30.0 - 36.0 g/dL   RDW 15.0 11.5 - 15.5 %   Platelets  334 150 - 400 K/uL   Neutrophils Relative % 82 %   Neutro Abs 12.8 (H) 1.7 - 7.7 K/uL   Lymphocytes Relative 12 %   Lymphs Abs 2.0 0.7 - 4.0 K/uL   Monocytes Relative 5 %   Monocytes Absolute 0.8 0.1 - 1.0 K/uL   Eosinophils Relative 1 %   Eosinophils Absolute 0.1 0.0 - 0.7 K/uL   Basophils Relative 0 %   Basophils Absolute 0.0 0.0 - 0.1 K/uL    Comment: Performed at [facility], [place] [address] [place], [place] [address]  D-dimer, quantitative     Status: Abnormal   Collection Time: 09/15/2017  7:03 AM  Result Value Ref Range   D-Dimer, Quant >20.00 (H) 0.00 - 0.50 ug/mL-FEU    Comment: REPEATED TO VERIFY (NOTE) At the manufacturer cut-off of 0.50 ug/mL FEU, this assay has been documented to exclude PE with a sensitivity and negative predictive value of 97 to 99%.  At this time, this assay has not been approved by the FDA  to exclude DVT/VTE. Results should be correlated with clinical presentation. Performed at [facility], [place] [address] [place], [place] [address]   Protime-INR     Status: None   Collection Time: 09/12/2017  7:03 AM  Result Value Ref Range   Prothrombin Time 14.1 11.4 - 15.2 seconds   INR 1.10     Comment: Performed at [facility], [place] [address] [place], [place] [address]  APTT     Status: Abnormal   Collection Time: 09/14/2017  7:03 AM  Result Value Ref Range   aPTT 21 (L) 24 - 36 seconds    Comment: Performed at [facility], [place] [address], [place], [place] [address]  Comprehensive metabolic panel     Status: Abnormal   Collection Time: 09/10/2017  8:30 AM  Result Value Ref Range   Sodium 133 (L) 135 - 145 mmol/L   Potassium 5.6 (H) 3.5 - 5.1 mmol/L    Comment: MODERATE HEMOLYSIS   Chloride 94 (L) 101 - 111 mmol/L   CO2 24 22 - 32 mmol/L   Glucose, Bld 114 (H) 65 - 99 mg/dL   BUN 69 (H) 6 - 20 mg/dL   Creatinine, Ser 2.66 (H) 0.44 - 1.00 mg/dL   Calcium 9.4 8.9 - 10.3 mg/dL    Total Protein 7.8 6.5 - 8.1 g/dL   Albumin 2.7 (L) 3.5 - 5.0 g/dL   AST 37 15 - 41 U/L   ALT 28 14 - 54 U/L   Alkaline Phosphatase 53 38 - 126 U/L   Total Bilirubin 1.0 0.3 - 1.2 mg/dL   GFR calc non Af Amer 19 (L) >60 mL/min   GFR calc Af Amer 22 (L) >60 mL/min    Comment: (NOTE) The eGFR has been calculated using the CKD EPI equation. This calculation has not been validated in all clinical situations. eGFR's persistently <60 mL/min signify possible Chronic Kidney Disease.    Anion gap 15 5 - 15    Comment: Performed at [facility], [place] [address], [place], [place] [address]   Ct Abdomen Pelvis Wo Contrast  Result Date: 10/06/2017 CLINICAL DATA:  Abdominal distention, pelvic pain.  Cervical cancer. EXAM: CT ABDOMEN AND PELVIS WITHOUT CONTRAST TECHNIQUE: Multidetector CT imaging of the abdomen and pelvis was performed following the standard protocol without IV contrast. COMPARISON:  09/15/2017 FINDINGS: Lower chest: Increasing bibasilar airspace opacities, left greater than right, likely atelectasis although pneumonia cannot be excluded, particularly at the left base. No effusions. Heart is normal size. Hepatobiliary: No focal hepatic abnormality. Layering slightly high-density material noted in the gallbladder could reflect sludge or stones. Pancreas: No focal abnormality or ductal dilatation. Spleen: No focal abnormality.  Normal size. Adrenals/Urinary Tract: Small hyperdense area within the midpole of the left kidney, likely small hemorrhagic cyst. No hydronephrosis. Punctate calcification in the lower pole of the right kidney. Adrenal glands unremarkable. Urinary bladder decompressed, grossly unremarkable. Stomach/Bowel: Large stool burden in the colon, particularly the right colon and transverse colon. Mild distention of small bowel loops in the upper abdomen and decompressed small bowel loops distally. Findings could reflect low grade small bowel obstruction.  Vascular/Lymphatic: Scattered aortic calcifications. No aneurysm or adenopathy. Borderline inguinal lymph nodes bilaterally. Index left inguinal lymph node has a short axis diameter of 11 mm on image 84. Similarly sized bilateral inguinal lymph nodes. Reproductive: Previously seen heterogeneous enhancement and multiple focal masses throughout the uterus is not as well visualized on this unenhanced study. No  definite visible adnexal mass. Other: Moderate free fluid in the abdomen and pelvis, increasing since prior study. There is stranding and thickening of the omentum in the left anterior abdomen concerning for omental/peritoneal disease/caking. Musculoskeletal: Sclerosis within the right iliac bone adjacent to the SI joint may be related to sacroiliitis. This is stable. No acute bony abnormality. IMPRESSION: The complex uterine/pelvic process seen on prior contrast enhanced study is not as well visualized on today's unenhanced study. There is increasing ascites in the abdomen and pelvis, and increasing omental thickening/stranding concerning for mental metastatic disease/caking. Large amount of stool in the right colon and transverse colon. Prominent upper abdominal small bowel loops. Distal small bowel loops are decompressed. Findings could reflect low grade partial small bowel obstruction. Exact cause or transition not visible. Bibasilar atelectasis or infiltrates, left greater than right. Electronically Signed   By: Rolm Baptise M.D.   On: 09/23/2017 13:49   Dg Chest 2 View  Result Date: 09/22/2017 CLINICAL DATA:  Shortness of breath EXAM: CHEST - 2 VIEW COMPARISON:  Chest radiograph and chest CT September 15, 2017 FINDINGS: Degree of inspiration is shallow. There is atelectatic change in both lung bases. There is no frank edema or consolidation. Heart size and pulmonary vascularity are normal. No adenopathy. No evident bone lesions. IMPRESSION: Shallow degree of inspiration with bibasilar atelectasis. No frank  edema or consolidation. Stable cardiac silhouette. Electronically Signed   By: Lowella Grip III M.D.   On: 09/29/2017 10:39    Pending Labs [employer] (From admission, onward)   Start     Ordered   Signed and Held  HIV antibody (Routine Testing)  Once,   R     Signed and Held   Signed and Held  CBC  (heparin)  Once,   R    Comments:  Baseline for heparin therapy IF NOT ALREADY DRAWN.  Notify MD if PLT < 100 K.    Signed and Held   Signed and Held  Creatinine, serum  (heparin)  Once,   R    Comments:  Baseline for heparin therapy IF NOT ALREADY DRAWN.    Signed and Held      Vitals/Pain Today's Vitals   10/02/2017 1335 09/11/2017 1359 09/21/2017 1400 10/08/2017 1500  BP: 98/64  123/70 125/71  Pulse: (!) 110     Resp: (!) 29 (!) 23 (!) 23 (!) 23  Temp: 98 F (36.7 C)     TempSrc: Oral     SpO2: 95%     Weight:      Height:      PainSc:        Isolation Precautions No active isolations  Medications Medications  lactated ringers bolus 1,000 mL (1,000 mLs Intravenous New Bag/Given 09/30/2017 1253)  fentaNYL (SUBLIMAZE) injection 75 mcg (75 mcg Intravenous Given 09/12/2017 0853)  iopamidol (ISOVUE-300) 61 % injection 30 mL (30 mLs Oral Contrast Given 09/27/2017 1017)  fentaNYL (SUBLIMAZE) injection 75 mcg (75 mcg Intravenous Given 09/28/2017 1252)    Mobility walks

## 2017-10-03 NOTE — ED Notes (Signed)
TWO UNSUCCESSFUL ATTEMPTS TO COLLECT LABS. 

## 2017-10-03 NOTE — ED Notes (Signed)
ED Provider at bedside. 

## 2017-10-03 NOTE — ED Triage Notes (Signed)
Pt is c/o severe pelvic pain and constipation  Pt states she had a biopsy on the 16th and was diagnosed with cervical cancer  Pt is c/o generalized fatigue and weakness  Pt states she also has swelling to her lower extremities the left worse than the right

## 2017-10-03 NOTE — ED Notes (Signed)
DRINKING CT ORAL CONTRAST

## 2017-10-03 NOTE — ED Provider Notes (Signed)
[facility] DEPT Provider Note   CSN: [id number] Arrival date & time: 09/20/2017  0341     History   Chief Complaint Chief Complaint  Patient presents with  . Pelvic Pain    HPI Rebecca [name] is a 57 y.o. female.  HPI 57 year old female comes in with chief complaint of abdominal pain. Patient was recently diagnosed with cervical cancer and is undergoing further diagnostic workup.  Patient states that over the past 2 weeks she has been having constipation, early satiety and worsening of epigastric abdominal pain.  Patient's last normal bowel movement was 2 weeks ago.  Patient is also complaining of weakness and shortness of breath with exertion.  Patient also reports new leg swelling, but informs me that she had DVT studies that were negative.  Patient is taking oxycodone, MiraLAX and suppository at home without significant help.  Past Medical History:  Diagnosis Date  . Aortic atherosclerosis ([place])   . Ascites    Minimal  . Constipation   . Dyspnea   . Fatty liver   . Fibroids   . Hyperlipidemia   . Hypertension   . Lower leg edema   . Obese   . Right leg pain     Patient Active Problem List   Diagnosis Date Noted  . Cervical mass 09/25/2017  . Cervical cancer ([place]) 09/25/2017  . Postmenopausal bleeding 09/12/2017  . Abdominal pain 09/12/2017  . Bloating 09/12/2017    Past Surgical History:  Procedure Laterality Date  . DILATION AND CURETTAGE OF UTERUS  2000   SAB     OB History    Gravida  2   Para  0   Term  0   Preterm  0   AB  2   Living  0     SAB  2   TAB  0   Ectopic  0   Multiple  0   Live Births  0            Home Medications    Prior to Admission medications   Medication Sig Start Date End Date Taking? Authorizing Provider  lisinopril-hydrochlorothiazide (PRINZIDE,ZESTORETIC) 20-12.5 MG tablet Take 1 tablet by mouth daily.   Yes [provider]  Multiple Vitamin (MULTIVITAMIN)  tablet Take 1 tablet by mouth daily.   Yes [provider]  oxyCODONE (OXY IR/ROXICODONE) 5 MG immediate release tablet Take 5 mg by mouth every 4 (four) hours as needed for severe pain.   Yes [provider]    Family History Family History  Problem Relation Age of Onset  . Hypertension Mother   . Breast cancer Maternal Aunt 39    Social History Social History   Tobacco Use  . Smoking status: Never Smoker  . Smokeless tobacco: Never Used  Substance Use Topics  . Alcohol use: Never    Frequency: Never  . Drug use: Never     Allergies   Patient has no known allergies.   Review of Systems Review of Systems  Constitutional: Positive for activity change and fatigue.  Respiratory: Positive for shortness of breath.   Cardiovascular: Negative for chest pain.  Gastrointestinal: Positive for abdominal pain and constipation.  Allergic/Immunologic: Negative for immunocompromised state.  Neurological: Positive for weakness.  All other systems reviewed and are negative.    Physical Exam Updated Vital Signs BP 114/69   Pulse (!) 111   Temp 98.5 F (36.9 C) (Oral)   Resp 16   Ht 5\' 7"  (1.702 m)  Wt 85.3 kg (188 lb)   SpO2 99%   BMI 29.44 kg/m   Physical Exam  Constitutional: She is oriented to person, place, and time. She appears well-developed.  HENT:  Head: Normocephalic and atraumatic.  Eyes: EOM are normal.  Neck: Normal range of motion. Neck supple.  Cardiovascular:  Tachycardia  Pulmonary/Chest: Effort normal.  Abdominal: Bowel sounds are normal. She exhibits distension. There is tenderness. There is no rebound and no guarding.  Musculoskeletal: She exhibits edema.  Bilateral lower extremity pitting edema 1+, left worse than right  Neurological: She is alert and oriented to person, place, and time.  Skin: Skin is warm and dry.  Nursing note and vitals reviewed.    ED Treatments / Results  Labs (all labs ordered are listed, but only  abnormal results are displayed) Labs Reviewed  CBC WITH DIFFERENTIAL/PLATELET - Abnormal; Notable for the following components:      Result Value   WBC 15.8 (*)    RBC 3.48 (*)    Hemoglobin 9.0 (*)    HCT 29.0 (*)    MCH 25.9 (*)    Neutro Abs 12.8 (*)    All other components within normal limits  D-DIMER, QUANTITATIVE (NOT AT [facility])  PROTIME-INR  APTT  COMPREHENSIVE METABOLIC PANEL    EKG None  Radiology No results found.  Procedures Procedures (including critical care time)  Medications Ordered in ED Medications  lactated ringers bolus 1,000 mL (has no administration in time range)  fentaNYL (SUBLIMAZE) injection 75 mcg (has no administration in time range)     Initial Impression / Assessment and Plan / ED Course  I have reviewed the triage vital signs and the nursing notes.  Pertinent labs & imaging results that were available during my care of the patient were reviewed by me and considered in my medical decision making (see chart for details).     57 year old female comes in with chief complaint of abdominal pain.  Patient also has constipation, early satiety and weakness.  She is also complaining of shortness of breath with exertion.  Patient recently was diagnosed with cervical cancer.  CT scan had shown spread of the disease into the omentum process.  Patient is still getting diagnostic workup -her PET scan and ultrasound are pending.  At this time the differential diagnosis includes cancer related pain, constipation, spreading of metastases leading to small bowel obstruction.  PE is also in the differential diagnosis given the exertional dyspnea, however the weakness and shortness of breath could purely be due to failure to thrive and cancer.  Plan is to get basic labs and d-dimer. If the d-dimer is positive then patient will need CT angios chest and CT abdomen and pelvis. If the d-dimer is negative then we will only get CT abdomen and pelvis.  Disposition as  per CT scan.  Final Clinical Impressions(s) / ED Diagnoses   Final diagnoses:  None    ED Discharge Orders    None       Varney Biles, MD 09/12/2017 [phone]

## 2017-10-03 NOTE — ED Provider Notes (Signed)
We will assumed care of the patient at 8 AM from Dr. Kathrynn Humble.  Patient waiting on labs and imaging.  Patient with worsening abdominal pain, distention, minimal bowel movements, nausea and vomiting.  Patient with recent diagnosis of cancer undergoing biopsies and still waiting to do PET scan.  Here patient is found to have new acute kidney injury with a creatinine of 2.6 from a baseline of less than 1 just earlier this month.  Leukocytosis of 15,000 with hemoglobin that is not significantly different.  Patient CT is significant for increased ascites in the abdomen and pelvis and increased omental thickening and stranding concerning for omental metastatic disease and caking.  Large amount of stool in the right colon and transverse colon.  Prominent upper abdominal small bowel loops and distal small bowel loops are decompressed reflecting a low grade partial small bowel obstruction.  There is no exact transition point.  On reevaluation patient's nausea is currently controlled and pain is okay.  Will admit for rehydration, ensure that patient is able to tolerate p.o.'s.  She is denying any infectious symptoms at this time.  Chest x-ray without acute findings.   Blanchie Dessert, MD 09/16/2017 1408

## 2017-10-03 NOTE — H&P (Signed)
History and Physical    Rebecca [name] JIR:[id number] DOB: 12-03-1960 DOA: 09/28/2017  PCP: Nicholos Johns, MD  Patient coming from: Home  I have personally briefly reviewed patient's old medical records in [place]  Chief Complaint: bloating and abdominal discomfort after eating  HPI: Rebecca [name] is a 56 y.o. female with medical history significant of recent diagnosis of cervical cancer with pelvic mass and CT scan this admission suspicious for metastasis. His presenting to the hospital with complaints of abdominal discomfort after eating and bloating. The patient reports this is been going on for 2 weeks. Over the last few days has been gradually getting worse. Nothing she is aware of makes it better.as a result of her discomfort she has not been eating or drinking well  ED Course: Further evaluation with CT scan which report partial SBO  Review of Systems: As per HPI otherwise 10 point review of systems negative.   Past Medical History:  Diagnosis Date  . Aortic atherosclerosis ([place])   . Ascites    Minimal  . Constipation   . Dyspnea   . Fatty liver   . Fibroids   . Hyperlipidemia   . Hypertension   . Lower leg edema   . Obese   . Right leg pain     Past Surgical History:  Procedure Laterality Date  . DILATION AND CURETTAGE OF UTERUS  2000   SAB     reports that she has never smoked. She has never used smokeless tobacco. She reports that she does not drink alcohol or use drugs.  No Known Allergies  Family History  Problem Relation Age of Onset  . Hypertension Mother   . Breast cancer Maternal Aunt 50     Prior to Admission medications   Medication Sig Start Date End Date Taking? Authorizing Provider  lisinopril-hydrochlorothiazide (PRINZIDE,ZESTORETIC) 20-12.5 MG tablet Take 1 tablet by mouth daily.   Yes [provider]  Multiple Vitamin (MULTIVITAMIN) tablet Take 1 tablet by mouth daily.   Yes [provider]  oxyCODONE (OXY  IR/ROXICODONE) 5 MG immediate release tablet Take 5 mg by mouth every 4 (four) hours as needed for severe pain.   Yes [provider]    Physical Exam: Vitals:   10/07/2017 1310 09/28/2017 1335 09/15/2017 1359 09/28/2017 1400  BP:  98/64  123/70  Pulse: (!) 107 (!) 110    Resp: 20 (!) 29 (!) 23 (!) 23  Temp:  98 F (36.7 C)    TempSrc:  Oral    SpO2: 96% 95%    Weight:      Height:        Constitutional: NAD, calm, comfortable Vitals:   09/29/2017 1310 09/15/2017 1335 09/13/2017 1359 09/17/2017 1400  BP:  98/64  123/70  Pulse: (!) 107 (!) 110    Resp: 20 (!) 29 (!) 23 (!) 23  Temp:  98 F (36.7 C)    TempSrc:  Oral    SpO2: 96% 95%    Weight:      Height:       Eyes: PERRL, lids and conjunctivae normal ENMT: Mucous membranes are moist. Posterior pharynx clear of any exudate or lesions. Normal dentition.  Neck: normal, supple, no masses, no thyromegaly Respiratory: clear to auscultation bilaterally, no wheezing, no crackles. Normal respiratory effort. No accessory muscle use.  Cardiovascular: Regular rate and rhythm, no murmurs / rubs / gallops. No extremity edema. 2+ pedal pulses. No carotid bruits.  Abdomen: no tenderness, no masses palpated. No  hepatosplenomegaly.  Musculoskeletal: no clubbing / cyanosis. No joint deformity upper and lower extremities. Good ROM, no contractures. Normal muscle tone.  Skin: no rashes, lesions, ulcers. No induration Neurologic: CN 2-12 grossly intact. Sensation intact, DTR normal. Strength 5/5 in all 4.  Psychiatric: Normal judgment and insight. Alert and oriented x 3. Normal mood.    Labs on Admission: I have personally reviewed following labs and imaging studies  CBC: Recent Labs  Lab 09/20/2017 0703  WBC 15.8*  NEUTROABS 12.8*  HGB 9.0*  HCT 29.0*  MCV 83.3  PLT 824   Basic Metabolic Panel: Recent Labs  Lab 10/05/2017 0830  NA 133*  K 5.6*  CL 94*  CO2 24  GLUCOSE 114*  BUN 69*  CREATININE 2.66*  CALCIUM 9.4    GFR: Estimated Creatinine Clearance: 27.1 mL/min (A) (by C-G formula based on SCr of 2.66 mg/dL (H)). Liver Function Tests: Recent Labs  Lab 09/12/2017 0830  AST 37  ALT 28  ALKPHOS 53  BILITOT 1.0  PROT 7.8  ALBUMIN 2.7*   No results for input(s): LIPASE, AMYLASE in the last 168 hours. No results for input(s): AMMONIA in the last 168 hours. Coagulation Profile: Recent Labs  Lab 10/06/2017 0703  INR 1.10   Cardiac Enzymes: No results for input(s): CKTOTAL, CKMB, CKMBINDEX, TROPONINI in the last 168 hours. BNP (last 3 results) No results for input(s): PROBNP in the last 8760 hours. HbA1C: No results for input(s): HGBA1C in the last 72 hours. CBG: No results for input(s): GLUCAP in the last 168 hours. Lipid Profile: No results for input(s): CHOL, HDL, LDLCALC, TRIG, CHOLHDL, LDLDIRECT in the last 72 hours. Thyroid Function Tests: No results for input(s): TSH, T4TOTAL, FREET4, T3FREE, THYROIDAB in the last 72 hours. Anemia Panel: No results for input(s): VITAMINB12, FOLATE, FERRITIN, TIBC, IRON, RETICCTPCT in the last 72 hours. Urine analysis:    Component Value Date/Time   COLORURINE YELLOW 09/15/2017 1832   APPEARANCEUR CLEAR 09/15/2017 1832   LABSPEC 1.016 09/15/2017 1832   PHURINE 6.0 09/15/2017 1832   GLUCOSEU NEGATIVE 09/15/2017 1832   HGBUR MODERATE (A) 09/15/2017 1832   BILIRUBINUR NEGATIVE 09/15/2017 1832   KETONESUR NEGATIVE 09/15/2017 1832   PROTEINUR NEGATIVE 09/15/2017 1832   NITRITE NEGATIVE 09/15/2017 1832   LEUKOCYTESUR MODERATE (A) 09/15/2017 1832    Radiological Exams on Admission: Ct Abdomen Pelvis Wo Contrast  Result Date: 09/14/2017 CLINICAL DATA:  Abdominal distention, pelvic pain.  Cervical cancer. EXAM: CT ABDOMEN AND PELVIS WITHOUT CONTRAST TECHNIQUE: Multidetector CT imaging of the abdomen and pelvis was performed following the standard protocol without IV contrast. COMPARISON:  09/15/2017 FINDINGS: Lower chest: Increasing bibasilar  airspace opacities, left greater than right, likely atelectasis although pneumonia cannot be excluded, particularly at the left base. No effusions. Heart is normal size. Hepatobiliary: No focal hepatic abnormality. Layering slightly high-density material noted in the gallbladder could reflect sludge or stones. Pancreas: No focal abnormality or ductal dilatation. Spleen: No focal abnormality.  Normal size. Adrenals/Urinary Tract: Small hyperdense area within the midpole of the left kidney, likely small hemorrhagic cyst. No hydronephrosis. Punctate calcification in the lower pole of the right kidney. Adrenal glands unremarkable. Urinary bladder decompressed, grossly unremarkable. Stomach/Bowel: Large stool burden in the colon, particularly the right colon and transverse colon. Mild distention of small bowel loops in the upper abdomen and decompressed small bowel loops distally. Findings could reflect low grade small bowel obstruction. Vascular/Lymphatic: Scattered aortic calcifications. No aneurysm or adenopathy. Borderline inguinal lymph nodes bilaterally. Index left inguinal lymph  node has a short axis diameter of 11 mm on image 84. Similarly sized bilateral inguinal lymph nodes. Reproductive: Previously seen heterogeneous enhancement and multiple focal masses throughout the uterus is not as well visualized on this unenhanced study. No definite visible adnexal mass. Other: Moderate free fluid in the abdomen and pelvis, increasing since prior study. There is stranding and thickening of the omentum in the left anterior abdomen concerning for omental/peritoneal disease/caking. Musculoskeletal: Sclerosis within the right iliac bone adjacent to the SI joint may be related to sacroiliitis. This is stable. No acute bony abnormality. IMPRESSION: The complex uterine/pelvic process seen on prior contrast enhanced study is not as well visualized on today's unenhanced study. There is increasing ascites in the abdomen and  pelvis, and increasing omental thickening/stranding concerning for mental metastatic disease/caking. Large amount of stool in the right colon and transverse colon. Prominent upper abdominal small bowel loops. Distal small bowel loops are decompressed. Findings could reflect low grade partial small bowel obstruction. Exact cause or transition not visible. Bibasilar atelectasis or infiltrates, left greater than right. Electronically Signed   By: Rolm Baptise M.D.   On: 09/25/2017 13:49   Dg Chest 2 View  Result Date: 09/12/2017 CLINICAL DATA:  Shortness of breath EXAM: CHEST - 2 VIEW COMPARISON:  Chest radiograph and chest CT September 15, 2017 FINDINGS: Degree of inspiration is shallow. There is atelectatic change in both lung bases. There is no frank edema or consolidation. Heart size and pulmonary vascularity are normal. No adenopathy. No evident bone lesions. IMPRESSION: Shallow degree of inspiration with bibasilar atelectasis. No frank edema or consolidation. Stable cardiac silhouette. Electronically Signed   By: Lowella Grip III M.D.   On: 10/05/2017 10:39    EKG: Independently reviewed. Sinus tachycardia with no ST elevations or depressions  Assessment/Plan Active Problems:   SBO (small bowel obstruction) ([place]) - confirmed on CT scan but reported as partial sbo - bowel rest, npo status (placed on d5 normal saline)  Hyperkalemia - Place on IV fluid rehydration and reassess next a.m.  AKI - Suspect secondary to prerenal etiology given reports history of poor oral intake we'll administer IV fluids and reassess next a.m.hould serum creatinine remain elevated despite IV fluid rehydration with then recommend expanding workup  Cervical cancer - confirmed on bx per chart review of GYN note - pt to continue f/u with GYN specialist for further evaluation. Metastasis suspected given CT scan results.   DVT prophylaxis: Heparin Code Status: full Family Communication: d/c patient and family member  at bedside. Disposition Plan: med surg Consults called: none Admission status: obs   Velvet Bathe MD [facility] Pager [phone]  If 7PM-7AM, please contact night-coverage [email or web address] Password [facility]  09/15/2017, 2:36 PM

## 2017-10-03 NOTE — ED Notes (Signed)
PT AND FAMILY AWARE OF PLAN OF CARE AND THIS WRITER HAS PAIN MEDICATION READY

## 2017-10-03 NOTE — ED Notes (Signed)
IV TEAM AT BEDSIDE 

## 2017-10-03 NOTE — ED Notes (Signed)
PT IS READY FOR ANGIO CT

## 2017-10-04 ENCOUNTER — Observation Stay (HOSPITAL_COMMUNITY): Payer: 59

## 2017-10-04 ENCOUNTER — Ambulatory Visit (HOSPITAL_COMMUNITY): Payer: 59

## 2017-10-04 ENCOUNTER — Ambulatory Visit (HOSPITAL_COMMUNITY): Payer: 59 | Attending: Psychiatry

## 2017-10-04 DIAGNOSIS — K56609 Unspecified intestinal obstruction, unspecified as to partial versus complete obstruction: Secondary | ICD-10-CM | POA: Diagnosis not present

## 2017-10-04 DIAGNOSIS — K5641 Fecal impaction: Secondary | ICD-10-CM | POA: Diagnosis not present

## 2017-10-04 DIAGNOSIS — C530 Malignant neoplasm of endocervix: Secondary | ICD-10-CM | POA: Diagnosis not present

## 2017-10-04 DIAGNOSIS — N179 Acute kidney failure, unspecified: Secondary | ICD-10-CM

## 2017-10-04 DIAGNOSIS — K56600 Partial intestinal obstruction, unspecified as to cause: Secondary | ICD-10-CM | POA: Diagnosis not present

## 2017-10-04 DIAGNOSIS — R180 Malignant ascites: Secondary | ICD-10-CM | POA: Diagnosis not present

## 2017-10-04 LAB — CBC WITH DIFFERENTIAL/PLATELET
BASOS ABS: 0 10*3/uL (ref 0.0–0.1)
BASOS PCT: 0 %
Eosinophils Absolute: 0.1 10*3/uL (ref 0.0–0.7)
Eosinophils Relative: 1 %
HEMATOCRIT: 27.6 % — AB (ref 36.0–46.0)
HEMOGLOBIN: 8.7 g/dL — AB (ref 12.0–15.0)
LYMPHS PCT: 12 %
Lymphs Abs: 1.5 10*3/uL (ref 0.7–4.0)
MCH: 26.3 pg (ref 26.0–34.0)
MCHC: 31.5 g/dL (ref 30.0–36.0)
MCV: 83.4 fL (ref 78.0–100.0)
Monocytes Absolute: 0.6 10*3/uL (ref 0.1–1.0)
Monocytes Relative: 5 %
NEUTROS ABS: 10.8 10*3/uL — AB (ref 1.7–7.7)
NEUTROS PCT: 82 %
Platelets: 286 10*3/uL (ref 150–400)
RBC: 3.31 MIL/uL — AB (ref 3.87–5.11)
RDW: 14.6 % (ref 11.5–15.5)
WBC: 13.1 10*3/uL — ABNORMAL HIGH (ref 4.0–10.5)

## 2017-10-04 LAB — COMPREHENSIVE METABOLIC PANEL
ALBUMIN: 2.4 g/dL — AB (ref 3.5–5.0)
ALK PHOS: 48 U/L (ref 38–126)
ALT: 22 U/L (ref 14–54)
AST: 23 U/L (ref 15–41)
Anion gap: 12 (ref 5–15)
BUN: 65 mg/dL — AB (ref 6–20)
CO2: 25 mmol/L (ref 22–32)
CREATININE: 2.09 mg/dL — AB (ref 0.44–1.00)
Calcium: 8.9 mg/dL (ref 8.9–10.3)
Chloride: 98 mmol/L — ABNORMAL LOW (ref 101–111)
GFR calc Af Amer: 30 mL/min — ABNORMAL LOW (ref >60)
GFR calc non Af Amer: 26 mL/min — ABNORMAL LOW (ref >60)
GLUCOSE: 132 mg/dL — AB (ref 65–99)
POTASSIUM: 4.5 mmol/L (ref 3.5–5.1)
Sodium: 135 mmol/L (ref 135–145)
Total Bilirubin: 0.6 mg/dL (ref 0.3–1.2)
Total Protein: 6.9 g/dL (ref 6.5–8.1)

## 2017-10-04 LAB — HIV ANTIBODY (ROUTINE TESTING W REFLEX): HIV SCREEN 4TH GENERATION: NONREACTIVE

## 2017-10-04 LAB — MAGNESIUM: Magnesium: 2.3 mg/dL (ref 1.7–2.4)

## 2017-10-04 LAB — OCCULT BLOOD X 1 CARD TO LAB, STOOL: Fecal Occult Bld: NEGATIVE

## 2017-10-04 MED ORDER — FENTANYL CITRATE (PF) 100 MCG/2ML IJ SOLN
INTRAMUSCULAR | Status: AC | PRN
  Administered 2017-10-04 (×2): 25 ug via INTRAVENOUS
  Administered 2017-10-04: 50 ug via INTRAVENOUS

## 2017-10-04 MED ORDER — FENTANYL CITRATE (PF) 100 MCG/2ML IJ SOLN
INTRAMUSCULAR | Status: AC
  Filled 2017-10-04: qty 2

## 2017-10-04 MED ORDER — MIDAZOLAM HCL 2 MG/2ML IJ SOLN
INTRAMUSCULAR | Status: AC
  Filled 2017-10-04: qty 2

## 2017-10-04 MED ORDER — LIDOCAINE HCL 1 % IJ SOLN
INTRAMUSCULAR | Status: AC
  Filled 2017-10-04: qty 20

## 2017-10-04 MED ORDER — MIDAZOLAM HCL 2 MG/2ML IJ SOLN
INTRAMUSCULAR | Status: AC | PRN
  Administered 2017-10-04 (×2): 1 mg via INTRAVENOUS

## 2017-10-04 NOTE — Progress Notes (Signed)
Patient ID: Rebecca [name], female   DOB: 1961-05-01, 57 y.o.   MRN: [id number]  PROGRESS NOTE    Rebecca [name]  MGQ:[id number] DOB: 10/22/1960 DOA: 09/13/2017 PCP: Nicholos Johns, MD   Brief Narrative:  57 year old female with right adnexal mass and postmenopausal bleeding status post recent cervical biopsy under anesthesia on 09/25/2017 and subsequent pathology showing adenocarcinoma presented on 09/20/2017 with bloating/abdominal discomfort and constipation.  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   Assessment & Plan:   Active Problems:   SBO (small bowel obstruction) (HCC)   Small bowel obstruction -Probably secondary to metastatic disease -Continue IV fluids and pain management.  Currently not nauseous or vomiting so will hold off on NG tube.  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  She recently had cervical biopsy done on 09/25/2017.  Spoke to Dr. Denman George about the patient and she would see the patient in consultation.  Leukocytosis -Probably reactive.  Repeat a.m. Labs  Hyperkalemia -Resolved.  Repeat a.m. Labs  Acute kidney injury -Probably secondary to poor oral intake and dehydration -Improving slightly.  Continue IV fluid hydration.  Repeat a.m. labs   DVT prophylaxis: Heparin Code Status: Full Family Communication: None at bedside Disposition Plan: Depends on clinical outcome  Consultants: General surgery/GYN oncology Dr. Denman George  Procedures: None  Antimicrobials: None   Subjective: Patient seen and examined at bedside.  She denies any overnight fever, nausea or vomiting.  No bowel movements yet and patient has not passed gas as well.  Objective: Vitals:   10/07/2017 1510 09/10/2017 1544 09/24/2017 2118 10/04/17 0527  BP:  120/74 111/63 100/64  Pulse:  (!) 110  (!) 106 (!) 101  Resp:  (!) 24 18 18   Temp: 98.6 F (37 C) 98.1 F (36.7 C) 99.3 F (37.4 C) 98.2 F (36.8 C)  TempSrc: Oral Oral Oral Oral  SpO2:  97% 96% 98%  Weight:      Height:        Intake/Output Summary (Last 24 hours) at 10/04/2017 1223 Last data filed at 10/04/2017 2671 Gross per 24 hour  Intake 2269.17 ml  Output -  Net 2269.17 ml   Filed Weights   09/20/2017 0419  Weight: 85.3 kg (188 lb)    Examination:  General exam: Appears calm and comfortable  Respiratory system: Bilateral decreased breath sound at bases Cardiovascular system: S1 & S2 heard, rate controlled  gastrointestinal system: Abdomen is distended, soft and nontender.  Bowel sounds sluggish  Central nervous system: Alert and oriented. No focal neurological deficits. Moving extremities Extremities: No cyanosis, clubbing; 1-2+ bilateral pitting edema Skin: No rashes, lesions or ulcers Psychiatry: Judgement and insight appear normal. Mood & affect appropriate.     Data Reviewed: I have personally reviewed following labs and imaging studies  CBC: Recent Labs  Lab 10/07/2017 0703 10/01/2017 1628 10/04/17 0813  WBC 15.8* 13.9* 13.1*  NEUTROABS 12.8*  --  10.8*  HGB 9.0* 8.9* 8.7*  HCT 29.0* 28.4* 27.6*  MCV 83.3 83.0 83.4  PLT 334 318 245   Basic Metabolic Panel: Recent Labs  Lab 09/19/2017 0830 10/07/2017 1628 10/04/17 0813  NA 133*  --  135  K 5.6*  --  4.5  CL 94*  --  98*  CO2 24  --  25  GLUCOSE 114*  --  132*  BUN 69*  --  65*  CREATININE 2.66* 2.47* 2.09*  CALCIUM 9.4  --  8.9  MG  --   --  2.3   GFR: Estimated Creatinine Clearance: 34.5 mL/min (A) (by C-G formula based on SCr of 2.09 mg/dL (H)). Liver Function Tests: Recent Labs  Lab 09/23/2017 0830 10/04/17 0813  AST 37 23  ALT 28 22  ALKPHOS 53 48  BILITOT 1.0 0.6  PROT 7.8 6.9  ALBUMIN 2.7* 2.4*   No results for input(s): LIPASE, AMYLASE in the last 168 hours. No results for input(s): AMMONIA in the last 168  hours. Coagulation Profile: Recent Labs  Lab 10/07/2017 0703  INR 1.10   Cardiac Enzymes: No results for input(s): CKTOTAL, CKMB, CKMBINDEX, TROPONINI in the last 168 hours. BNP (last 3 results) No results for input(s): PROBNP in the last 8760 hours. HbA1C: No results for input(s): HGBA1C in the last 72 hours. CBG: No results for input(s): GLUCAP in the last 168 hours. Lipid Profile: No results for input(s): CHOL, HDL, LDLCALC, TRIG, CHOLHDL, LDLDIRECT in the last 72 hours. Thyroid Function Tests: No results for input(s): TSH, T4TOTAL, FREET4, T3FREE, THYROIDAB in the last 72 hours. Anemia Panel: No results for input(s): VITAMINB12, FOLATE, FERRITIN, TIBC, IRON, RETICCTPCT in the last 72 hours. Sepsis Labs: No results for input(s): PROCALCITON, LATICACIDVEN in the last 168 hours.  No results found for this or any previous visit (from the past 240 hour(s)).       Radiology Studies: Ct Abdomen Pelvis Wo Contrast  Result Date: 09/15/2017 CLINICAL DATA:  Abdominal distention, pelvic pain.  Cervical cancer. EXAM: CT ABDOMEN AND PELVIS WITHOUT CONTRAST TECHNIQUE: Multidetector CT imaging of the abdomen and pelvis was performed following the standard protocol without IV contrast. COMPARISON:  09/15/2017 FINDINGS: Lower chest: Increasing bibasilar airspace opacities, left greater than right, likely atelectasis although pneumonia cannot be excluded, particularly at the left base. No effusions. Heart is normal size. Hepatobiliary: No focal hepatic abnormality. Layering slightly high-density material noted in the gallbladder could reflect sludge or stones. Pancreas: No focal abnormality or ductal dilatation. Spleen: No focal abnormality.  Normal size. Adrenals/Urinary Tract: Small hyperdense area within the midpole of the left kidney, likely small hemorrhagic cyst. No hydronephrosis. Punctate calcification in the lower pole of the right kidney. Adrenal glands unremarkable. Urinary bladder  decompressed, grossly unremarkable. Stomach/Bowel: Large stool burden in the colon, particularly the right colon and transverse colon. Mild distention of small bowel loops in the upper abdomen and decompressed small bowel loops distally. Findings could reflect low grade small bowel obstruction. Vascular/Lymphatic: Scattered aortic calcifications. No aneurysm or adenopathy. Borderline inguinal lymph nodes bilaterally. Index left inguinal lymph node has a short axis diameter of 11 mm on image 84. Similarly sized bilateral inguinal lymph nodes. Reproductive: Previously seen heterogeneous enhancement and multiple focal masses throughout the uterus is not as well visualized on this unenhanced study. No definite visible adnexal mass. Other: Moderate free fluid in the abdomen and pelvis, increasing since prior study. There is stranding and thickening of the omentum in the left anterior abdomen concerning for omental/peritoneal disease/caking. Musculoskeletal: Sclerosis within the right iliac bone adjacent to the SI joint may be related to sacroiliitis. This is stable. No acute bony abnormality. IMPRESSION: The complex uterine/pelvic process seen on prior contrast enhanced study is not as well visualized on today's unenhanced study. There is increasing ascites in the abdomen and pelvis, and increasing omental thickening/stranding concerning for mental metastatic disease/caking. Large amount of stool in the right colon and transverse colon. Prominent upper  abdominal small bowel loops. Distal small bowel loops are decompressed. Findings could reflect low grade partial small bowel obstruction. Exact cause or transition not visible. Bibasilar atelectasis or infiltrates, left greater than right. Electronically Signed   By: Rolm Baptise M.D.   On: 09/30/2017 13:49   Dg Chest 2 View  Result Date: 10/08/2017 CLINICAL DATA:  Shortness of breath EXAM: CHEST - 2 VIEW COMPARISON:  Chest radiograph and chest CT September 15, 2017  FINDINGS: Degree of inspiration is shallow. There is atelectatic change in both lung bases. There is no frank edema or consolidation. Heart size and pulmonary vascularity are normal. No adenopathy. No evident bone lesions. IMPRESSION: Shallow degree of inspiration with bibasilar atelectasis. No frank edema or consolidation. Stable cardiac silhouette. Electronically Signed   By: Lowella Grip III M.D.   On: 09/29/2017 10:39   Dg Abd 1 View  Result Date: 10/04/2017 CLINICAL DATA:  Abdominal pain, distention EXAM: ABDOMEN - 1 VIEW COMPARISON:  CT 09/22/2017 FINDINGS: Large stool burden throughout the colon. Mild gaseous distention of mid abdominal small bowel loops. No free air organomegaly. No acute bony abnormality. IMPRESSION: Continued large stool burden in the colon and mildly prominent mid abdominal small bowel loops. This could reflect ileus or small-bowel obstruction. Electronically Signed   By: Rolm Baptise M.D.   On: 10/04/2017 09:40        Scheduled Meds: . heparin  5,000 Units Subcutaneous Q8H  . multivitamin with minerals  1 tablet Oral Daily   Continuous Infusions: . dextrose 5 % and 0.9% NaCl 1,000 mL (09/26/2017 2315)     LOS: 0 days        Aline August, MD [facility] Pager [phone]  If 7PM-7AM, please contact night-coverage [email or web address] Password [facility] 10/04/2017, 12:23 PM

## 2017-10-04 NOTE — Procedures (Signed)
Interventional Radiology Procedure Note  Procedure: US guided bx left inguinal LN  Complications: None  Estimated Blood Loss: None  Recommendations: - Path pending  Signed,  Criselda Peaches, MD

## 2017-10-04 NOTE — Consult Note (Addendum)
Reason for Consult: SBO Referring Physician: Cyndi Bender  Rebecca [name] is an 57 y.o. female.   HPI: Patient is a 57 year old female who was recently evaluated and underwent an exam under anesthesia with cervical biopsies on 09/25/2017 by Dr. Everitt Amber.  She was referred for postmenopausal bleeding, a right adnexal mass, and elevated Ca 125.  They were concerned she had a stage IV malignancy before her biopsy.  Her CT was suggestive of disease within the peritoneal cavity, possibly omental and mesenteric nodularity.  Pathology has subsequently shown this to be positive for adenocarcinoma on her cervical biopsy.  Patient presented to the ED yesterday plan of severe pelvic pain and constipation she was complaining of generalized fatigue and weakness.  She has had issues with constipation and no bowel movement for 2 to 3 weeks.  She has had some loose stools that she calls "small squirts," over the last 2 weeks.  Her p.o. intake has been minimal, she says she feels up quickly and has discomfort.  She has not had nausea or vomiting, which she attributes primarily to her limited p.o. intake.    She had some swelling of her lower extremities.  And was aware of her diagnosis of cervical cancer.  She has had prior DVT studies which were reported negative.  Work-up in the ED show she is afebrile she is tachycardic but blood pressure is stable.  She has acute renal failure with creatinine up to 2.66.  Potassium of 5.6.  WBC 15.8, hemoglobin of 9, hematocrit of 29, platelet count is normal.  CT scan obtained yesterday shows bibasilar atelectasis, some possible sludge or stones in the gallbladder.  There is a large stool burden in the colon generally in the right and transverse colon.  There is mild distention of the small bowel loops in the upper abdomen and decompressed bowel loops distally reflecting a low-grade small bowel obstruction.  There is borderline inguinal lymphadenopathy bilaterally.  There is increasing  ascites in the abdomen pelvis and increasing omental thickening and stranding concerning for mental metastatic disease and caking.  We are asked to see to help evaluate for small bowel obstruction.  Past Medical History:  Diagnosis Date  . Aortic atherosclerosis ([place])   . Ascites    Minimal  . Constipation   . Dyspnea   . Fatty liver   . Fibroids   . Hyperlipidemia   . Hypertension   . Lower leg edema   . Obese   . Right leg pain     Past Surgical History:  Procedure Laterality Date  . DILATION AND CURETTAGE OF UTERUS  2000   SAB    Family History  Problem Relation Age of Onset  . Hypertension Mother   . Breast cancer Maternal Aunt 69    Social History:  reports that she has never smoked. She has never used smokeless tobacco. She reports that she does not drink alcohol or use drugs.  Allergies: No Known Allergies  Prior to Admission medications   Medication Sig Start Date End Date Taking? Authorizing Provider  lisinopril-hydrochlorothiazide (PRINZIDE,ZESTORETIC) 20-12.5 MG tablet Take 1 tablet by mouth daily.   Yes [provider]  Multiple Vitamin (MULTIVITAMIN) tablet Take 1 tablet by mouth daily.   Yes [provider]  oxyCODONE (OXY IR/ROXICODONE) 5 MG immediate release tablet Take 5 mg by mouth every 4 (four) hours as needed for severe pain.   Yes [provider]     Results for orders placed or performed during the  hospital encounter of 09/18/2017 (from the past 48 hour(s))  CBC with Differential     Status: Abnormal   Collection Time: 10/05/2017  7:03 AM  Result Value Ref Range   WBC 15.8 (H) 4.0 - 10.5 K/uL   RBC 3.48 (L) 3.87 - 5.11 MIL/uL   Hemoglobin 9.0 (L) 12.0 - 15.0 g/dL   HCT 29.0 (L) 36.0 - 46.0 %   MCV 83.3 78.0 - 100.0 fL   MCH 25.9 (L) 26.0 - 34.0 pg   MCHC 31.0 30.0 - 36.0 g/dL   RDW 15.0 11.5 - 15.5 %   Platelets 334 150 - 400 K/uL   Neutrophils Relative % 82 %   Neutro Abs 12.8 (H) 1.7 - 7.7 K/uL   Lymphocytes  Relative 12 %   Lymphs Abs 2.0 0.7 - 4.0 K/uL   Monocytes Relative 5 %   Monocytes Absolute 0.8 0.1 - 1.0 K/uL   Eosinophils Relative 1 %   Eosinophils Absolute 0.1 0.0 - 0.7 K/uL   Basophils Relative 0 %   Basophils Absolute 0.0 0.0 - 0.1 K/uL    Comment: Performed at [facility], [place] [address], [place], [place] [address]  D-dimer, quantitative     Status: Abnormal   Collection Time: 10/08/2017  7:03 AM  Result Value Ref Range   D-Dimer, Quant >20.00 (H) 0.00 - 0.50 ug/mL-FEU    Comment: REPEATED TO VERIFY (NOTE) At the manufacturer cut-off of 0.50 ug/mL FEU, this assay has been documented to exclude PE with a sensitivity and negative predictive value of 97 to 99%.  At this time, this assay has not been approved by the FDA to exclude DVT/VTE. Results should be correlated with clinical presentation. Performed at [facility], [place] [address], [place], [place] [address]   Protime-INR     Status: None   Collection Time: 10/04/2017  7:03 AM  Result Value Ref Range   Prothrombin Time 14.1 11.4 - 15.2 seconds   INR 1.10     Comment: Performed at [facility], [place] [address] [place], [place] [address]  APTT     Status: Abnormal   Collection Time: 09/13/2017  7:03 AM  Result Value Ref Range   aPTT 21 (L) 24 - 36 seconds    Comment: Performed at [facility], [place] [address] [place], [place] [address]  Comprehensive metabolic panel     Status: Abnormal   Collection Time: 09/15/2017  8:30 AM  Result Value Ref Range   Sodium 133 (L) 135 - 145 mmol/L   Potassium 5.6 (H) 3.5 - 5.1 mmol/L    Comment: MODERATE HEMOLYSIS   Chloride 94 (L) 101 - 111 mmol/L   CO2 24 22 - 32 mmol/L   Glucose, Bld 114 (H) 65 - 99 mg/dL   BUN 69 (H) 6 - 20 mg/dL   Creatinine, Ser 2.66 (H) 0.44 - 1.00 mg/dL   Calcium 9.4 8.9 - 10.3 mg/dL   Total Protein 7.8 6.5 - 8.1 g/dL   Albumin 2.7 (L) 3.5 - 5.0 g/dL   AST 37 15 - 41 U/L   ALT 28 14  - 54 U/L   Alkaline Phosphatase 53 38 - 126 U/L   Total Bilirubin 1.0 0.3 - 1.2 mg/dL   GFR calc non Af Amer 19 (L) >60 mL/min   GFR calc Af Amer 22 (L) >60 mL/min    Comment: (NOTE) The eGFR has been calculated using the CKD EPI equation. This calculation has  not been validated in all clinical situations. eGFR's persistently <60 mL/min signify possible Chronic Kidney Disease.    Anion gap 15 5 - 15    Comment: Performed at [facility], [place] [address], [place], [place] [address]  CBC     Status: Abnormal   Collection Time: 09/29/2017  4:28 PM  Result Value Ref Range   WBC 13.9 (H) 4.0 - 10.5 K/uL   RBC 3.42 (L) 3.87 - 5.11 MIL/uL   Hemoglobin 8.9 (L) 12.0 - 15.0 g/dL   HCT 28.4 (L) 36.0 - 46.0 %   MCV 83.0 78.0 - 100.0 fL   MCH 26.0 26.0 - 34.0 pg   MCHC 31.3 30.0 - 36.0 g/dL   RDW 14.5 11.5 - 15.5 %   Platelets 318 150 - 400 K/uL    Comment: Performed at [facility], [place] [address], [place], [place] [address]  Creatinine, serum     Status: Abnormal   Collection Time: 10/04/2017  4:28 PM  Result Value Ref Range   Creatinine, Ser 2.47 (H) 0.44 - 1.00 mg/dL   GFR calc non Af Amer 21 (L) >60 mL/min   GFR calc Af Amer 24 (L) >60 mL/min    Comment: (NOTE) The eGFR has been calculated using the CKD EPI equation. This calculation has not been validated in all clinical situations. eGFR's persistently <60 mL/min signify possible Chronic Kidney Disease. Performed at [facility], [place] [address], [place], [place] [address]   CBC with Differential/Platelet     Status: Abnormal   Collection Time: 10/04/17  8:13 AM  Result Value Ref Range   WBC 13.1 (H) 4.0 - 10.5 K/uL   RBC 3.31 (L) 3.87 - 5.11 MIL/uL   Hemoglobin 8.7 (L) 12.0 - 15.0 g/dL   HCT 27.6 (L) 36.0 - 46.0 %   MCV 83.4 78.0 - 100.0 fL   MCH 26.3 26.0 - 34.0 pg   MCHC 31.5 30.0 - 36.0 g/dL   RDW 14.6 11.5 - 15.5 %   Platelets 286 150 - 400 K/uL   Neutrophils  Relative % 82 %   Neutro Abs 10.8 (H) 1.7 - 7.7 K/uL   Lymphocytes Relative 12 %   Lymphs Abs 1.5 0.7 - 4.0 K/uL   Monocytes Relative 5 %   Monocytes Absolute 0.6 0.1 - 1.0 K/uL   Eosinophils Relative 1 %   Eosinophils Absolute 0.1 0.0 - 0.7 K/uL   Basophils Relative 0 %   Basophils Absolute 0.0 0.0 - 0.1 K/uL    Comment: Performed at [facility], [place] [address] [place], [place] [address]  Comprehensive metabolic panel     Status: Abnormal   Collection Time: 10/04/17  8:13 AM  Result Value Ref Range   Sodium 135 135 - 145 mmol/L    Comment: REPEATED TO VERIFY   Potassium 4.5 3.5 - 5.1 mmol/L    Comment: REPEATED TO VERIFY DELTA CHECK NOTED NO VISIBLE HEMOLYSIS    Chloride 98 (L) 101 - 111 mmol/L    Comment: REPEATED TO VERIFY   CO2 25 22 - 32 mmol/L    Comment: REPEATED TO VERIFY   Glucose, Bld 132 (H) 65 - 99 mg/dL   BUN 65 (H) 6 - 20 mg/dL   Creatinine, Ser 2.09 (H) 0.44 - 1.00 mg/dL   Calcium 8.9 8.9 - 10.3 mg/dL    Comment: REPEATED TO VERIFY   Total Protein 6.9 6.5 - 8.1 g/dL   Albumin 2.4 (L)  3.5 - 5.0 g/dL   AST 23 15 - 41 U/L   ALT 22 14 - 54 U/L   Alkaline Phosphatase 48 38 - 126 U/L   Total Bilirubin 0.6 0.3 - 1.2 mg/dL   GFR calc non Af Amer 26 (L) >60 mL/min   GFR calc Af Amer 30 (L) >60 mL/min    Comment: (NOTE) The eGFR has been calculated using the CKD EPI equation. This calculation has not been validated in all clinical situations. eGFR's persistently <60 mL/min signify possible Chronic Kidney Disease.    Anion gap 12 5 - 15    Comment: REPEATED TO VERIFY Performed at [place] [address], [place], [place] [address]   Magnesium     Status: None   Collection Time: 10/04/17  8:13 AM  Result Value Ref Range   Magnesium 2.3 1.7 - 2.4 mg/dL    Comment: Performed at [facility], [place] [address] [place], [place] [address]    Ct Abdomen Pelvis Wo Contrast  Result Date:  09/29/2017 CLINICAL DATA:  Abdominal distention, pelvic pain.  Cervical cancer. EXAM: CT ABDOMEN AND PELVIS WITHOUT CONTRAST TECHNIQUE: Multidetector CT imaging of the abdomen and pelvis was performed following the standard protocol without IV contrast. COMPARISON:  09/15/2017 FINDINGS: Lower chest: Increasing bibasilar airspace opacities, left greater than right, likely atelectasis although pneumonia cannot be excluded, particularly at the left base. No effusions. Heart is normal size. Hepatobiliary: No focal hepatic abnormality. Layering slightly high-density material noted in the gallbladder could reflect sludge or stones. Pancreas: No focal abnormality or ductal dilatation. Spleen: No focal abnormality.  Normal size. Adrenals/Urinary Tract: Small hyperdense area within the midpole of the left kidney, likely small hemorrhagic cyst. No hydronephrosis. Punctate calcification in the lower pole of the right kidney. Adrenal glands unremarkable. Urinary bladder decompressed, grossly unremarkable. Stomach/Bowel: Large stool burden in the colon, particularly the right colon and transverse colon. Mild distention of small bowel loops in the upper abdomen and decompressed small bowel loops distally. Findings could reflect low grade small bowel obstruction. Vascular/Lymphatic: Scattered aortic calcifications. No aneurysm or adenopathy. Borderline inguinal lymph nodes bilaterally. Index left inguinal lymph node has a short axis diameter of 11 mm on image 84. Similarly sized bilateral inguinal lymph nodes. Reproductive: Previously seen heterogeneous enhancement and multiple focal masses throughout the uterus is not as well visualized on this unenhanced study. No definite visible adnexal mass. Other: Moderate free fluid in the abdomen and pelvis, increasing since prior study. There is stranding and thickening of the omentum in the left anterior abdomen concerning for omental/peritoneal disease/caking. Musculoskeletal: Sclerosis  within the right iliac bone adjacent to the SI joint may be related to sacroiliitis. This is stable. No acute bony abnormality. IMPRESSION: The complex uterine/pelvic process seen on prior contrast enhanced study is not as well visualized on today's unenhanced study. There is increasing ascites in the abdomen and pelvis, and increasing omental thickening/stranding concerning for mental metastatic disease/caking. Large amount of stool in the right colon and transverse colon. Prominent upper abdominal small bowel loops. Distal small bowel loops are decompressed. Findings could reflect low grade partial small bowel obstruction. Exact cause or transition not visible. Bibasilar atelectasis or infiltrates, left greater than right. Electronically Signed   By: Rolm Baptise M.D.   On: 09/20/2017 13:49   Dg Chest 2 View  Result Date: 09/17/2017 CLINICAL DATA:  Shortness of breath EXAM: CHEST - 2 VIEW COMPARISON:  Chest radiograph and chest CT September 15, 2017  FINDINGS: Degree of inspiration is shallow. There is atelectatic change in both lung bases. There is no frank edema or consolidation. Heart size and pulmonary vascularity are normal. No adenopathy. No evident bone lesions. IMPRESSION: Shallow degree of inspiration with bibasilar atelectasis. No frank edema or consolidation. Stable cardiac silhouette. Electronically Signed   By: Lowella Grip III M.D.   On: 09/22/2017 10:39   Dg Abd 1 View  Result Date: 10/04/2017 CLINICAL DATA:  Abdominal pain, distention EXAM: ABDOMEN - 1 VIEW COMPARISON:  CT 10/05/2017 FINDINGS: Large stool burden throughout the colon. Mild gaseous distention of mid abdominal small bowel loops. No free air organomegaly. No acute bony abnormality. IMPRESSION: Continued large stool burden in the colon and mildly prominent mid abdominal small bowel loops. This could reflect ileus or small-bowel obstruction. Electronically Signed   By: Rolm Baptise M.D.   On: 10/04/2017 09:40    Review of  Systems  Constitutional: Positive for weight loss (she does not know how much).  HENT: Negative.   Eyes: Negative.   Respiratory: Negative.   Cardiovascular: Positive for leg swelling. Negative for chest pain, palpitations, orthopnea, claudication and PND.  Gastrointestinal: Positive for abdominal pain (upper abdomen pain and it seems to mostly occur after PO intake), constipation and diarrhea (loose squirts). Negative for blood in stool, heartburn, melena, nausea and vomiting.  Genitourinary: Negative.   Musculoskeletal: Negative.   Skin: Negative.   Neurological: Negative.   Endo/Heme/Allergies: Negative.   Psychiatric/Behavioral: Negative.    Blood pressure 100/64, pulse (!) 101, temperature 98.2 F (36.8 C), temperature source Oral, resp. rate 18, height _0  (1.702 m), weight 85.3 kg (188 lb), SpO2 98 %. Physical Exam  Constitutional: She is oriented to person, place, and time. She appears well-developed and well-nourished. No distress.  HENT:  Head: Normocephalic and atraumatic.  Mouth/Throat: Oropharynx is clear and moist. No oropharyngeal exudate.  Eyes: Right eye exhibits no discharge. Left eye exhibits no discharge. No scleral icterus.  Pupils are equal  Neck: Normal range of motion. Neck supple. No JVD present. No tracheal deviation present. No thyromegaly present.  Cardiovascular: Normal rate, regular rhythm, normal heart sounds and intact distal pulses.  No murmur heard. Respiratory: Effort normal and breath sounds normal. No respiratory distress. She has no wheezes. She has no rales. She exhibits no tenderness.  GI: Soft. Bowel sounds are normal. She exhibits distension. She exhibits no mass. There is no tenderness. There is no rebound and no guarding.  Genitourinary:  Genitourinary Comments: Some hard stool felt at the distal  tip of my finger on rectal exam.  No blood no masses felt.  It was very uncomfortable for the patient.    Musculoskeletal: She exhibits no edema  or tenderness.  Lymphadenopathy:    She has no cervical adenopathy.  Neurological: She is alert and oriented to person, place, and time. No cranial nerve deficit.  Skin: Skin is warm and dry. No rash noted. She is not diaphoretic. No erythema. No pallor.  Psychiatric: She has a normal mood and affect. Her behavior is normal. Judgment and thought content normal.    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   Exam under anesthesia/cervical biopsy 09/25/2017 Dr. Everitt Amber Acute renal failure/dehydration Severe constipation x2 to 3 weeks Hypertension Lower extremity swelling and edema -with negative DVT vascular study 09/15/17  Plan: Constipation and a small bowel obstruction for at least 2 to 3 weeks.  Concern for obstruction  secondary to metastatic cancer.  She is not having any nausea or vomiting.  She is undergoing rehydration and is n.p.o. currently.  Will await evaluation by Dr. Denman George and review of CT by Dr. Hassell Done.  She also needs to be seen by oncology for possible adjuvant therapy.   Since she is not vomiting I would not place an NG at this time and consider small bowel protocol after everyone's had an opportunity to see and reviewed patients exam and studies.  Reviewed with Dr. Hassell Done and we will try SSE this PM and see if this helps.  No hx of colonoscopy with negative Cologuard study about 2 years ago.    Clydette Privitera 10/04/2017, 9:44 AM

## 2017-10-04 NOTE — Consult Note (Signed)
Chief Complaint: Patient was seen in consultation today for image guided left inguinal lymph node biopsy Chief Complaint  Patient presents with  . Pelvic Pain    Referring Physician(s): Rossi,E  Supervising Physician: Jacqulynn Cadet  Patient Status: [facility] - In-pt  History of Present Illness: Rebecca [name] is a 57 y.o. female with history of recently diagnosed cervical cancer who was originally scheduled to undergo OP left inguinal lymph node biopsy today. She was admitted to [facility] yesterday with persistent abd pain /ileus/partial SBO. Request now received from gyn/onc to proceed with inguinal lymph node biopsy.  Past Medical History:  Diagnosis Date  . Aortic atherosclerosis ([place])   . Ascites    Minimal  . Constipation   . Dyspnea   . Fatty liver   . Fibroids   . Hyperlipidemia   . Hypertension   . Lower leg edema   . Obese   . Right leg pain     Past Surgical History:  Procedure Laterality Date  . DILATION AND CURETTAGE OF UTERUS  2000   SAB    Allergies: Patient has no known allergies.  Medications: Prior to Admission medications   Medication Sig Start Date End Date Taking? Authorizing Provider  lisinopril-hydrochlorothiazide (PRINZIDE,ZESTORETIC) 20-12.5 MG tablet Take 1 tablet by mouth daily.   Yes [provider]  Multiple Vitamin (MULTIVITAMIN) tablet Take 1 tablet by mouth daily.   Yes [provider]  oxyCODONE (OXY IR/ROXICODONE) 5 MG immediate release tablet Take 5 mg by mouth every 4 (four) hours as needed for severe pain.   Yes [provider]     Family History  Problem Relation Age of Onset  . Hypertension Mother   . Breast cancer Maternal Aunt 37    Social History   Socioeconomic History  . Marital status: Single    Spouse name: Not on file  . Number of children: Not on file  . Years of education: Not on file  . Highest education level: Not on file  Occupational History  . Not on file  Social Needs  .  Financial resource strain: Not on file  . Food insecurity:    Worry: Not on file    Inability: Not on file  . Transportation needs:    Medical: Not on file    Non-medical: Not on file  Tobacco Use  . Smoking status: Never Smoker  . Smokeless tobacco: Never Used  Substance and Sexual Activity  . Alcohol use: Never    Frequency: Never  . Drug use: Never  . Sexual activity: Not Currently    Birth control/protection: Post-menopausal  Lifestyle  . Physical activity:    Days per week: Not on file    Minutes per session: Not on file  . Stress: Not on file  Relationships  . Social connections:    Talks on phone: Not on file    Gets together: Not on file    Attends religious service: Not on file    Active member of club or organization: Not on file    Attends meetings of clubs or organizations: Not on file    Relationship status: Not on file  Other Topics Concern  . Not on file  Social History Narrative  . Not on file      Review of Systems denies fever, HA,CP, dyspnea, cough, back pain,N/V; she does have some vaginal bleeding/spotting  Vital Signs: BP 100/64 (BP Location: Left Arm)   Pulse (!) 101   Temp 98.2 F (36.8 C) (  Oral)   Resp 18   Ht 5\' 7"  (1.702 m)   Wt 188 lb (85.3 kg)   SpO2 98%   BMI 29.44 kg/m   Physical Exam awake/alert; chest- sl dim BS bases; heart- tachy, reg; abd- soft, few BS, sl dist,  bilat LE edema noted  Imaging: Ct Abdomen Pelvis Wo Contrast  Result Date: 10/08/2017 CLINICAL DATA:  Abdominal distention, pelvic pain.  Cervical cancer. EXAM: CT ABDOMEN AND PELVIS WITHOUT CONTRAST TECHNIQUE: Multidetector CT imaging of the abdomen and pelvis was performed following the standard protocol without IV contrast. COMPARISON:  09/15/2017 FINDINGS: Lower chest: Increasing bibasilar airspace opacities, left greater than right, likely atelectasis although pneumonia cannot be excluded, particularly at the left base. No effusions. Heart is normal size.  Hepatobiliary: No focal hepatic abnormality. Layering slightly high-density material noted in the gallbladder could reflect sludge or stones. Pancreas: No focal abnormality or ductal dilatation. Spleen: No focal abnormality.  Normal size. Adrenals/Urinary Tract: Small hyperdense area within the midpole of the left kidney, likely small hemorrhagic cyst. No hydronephrosis. Punctate calcification in the lower pole of the right kidney. Adrenal glands unremarkable. Urinary bladder decompressed, grossly unremarkable. Stomach/Bowel: Large stool burden in the colon, particularly the right colon and transverse colon. Mild distention of small bowel loops in the upper abdomen and decompressed small bowel loops distally. Findings could reflect low grade small bowel obstruction. Vascular/Lymphatic: Scattered aortic calcifications. No aneurysm or adenopathy. Borderline inguinal lymph nodes bilaterally. Index left inguinal lymph node has a short axis diameter of 11 mm on image 84. Similarly sized bilateral inguinal lymph nodes. Reproductive: Previously seen heterogeneous enhancement and multiple focal masses throughout the uterus is not as well visualized on this unenhanced study. No definite visible adnexal mass. Other: Moderate free fluid in the abdomen and pelvis, increasing since prior study. There is stranding and thickening of the omentum in the left anterior abdomen concerning for omental/peritoneal disease/caking. Musculoskeletal: Sclerosis within the right iliac bone adjacent to the SI joint may be related to sacroiliitis. This is stable. No acute bony abnormality. IMPRESSION: The complex uterine/pelvic process seen on prior contrast enhanced study is not as well visualized on today's unenhanced study. There is increasing ascites in the abdomen and pelvis, and increasing omental thickening/stranding concerning for mental metastatic disease/caking. Large amount of stool in the right colon and transverse colon. Prominent  upper abdominal small bowel loops. Distal small bowel loops are decompressed. Findings could reflect low grade partial small bowel obstruction. Exact cause or transition not visible. Bibasilar atelectasis or infiltrates, left greater than right. Electronically Signed   By: Rolm Baptise M.D.   On: 09/12/2017 13:49   Dg Chest 2 View  Result Date: 09/11/2017 CLINICAL DATA:  Shortness of breath EXAM: CHEST - 2 VIEW COMPARISON:  Chest radiograph and chest CT September 15, 2017 FINDINGS: Degree of inspiration is shallow. There is atelectatic change in both lung bases. There is no frank edema or consolidation. Heart size and pulmonary vascularity are normal. No adenopathy. No evident bone lesions. IMPRESSION: Shallow degree of inspiration with bibasilar atelectasis. No frank edema or consolidation. Stable cardiac silhouette. Electronically Signed   By: Lowella Grip III M.D.   On: 09/23/2017 10:39   Dg Chest 2 View  Result Date: 09/15/2017 CLINICAL DATA:  Shortness of breath EXAM: CHEST - 2 VIEW COMPARISON:  None. FINDINGS: Low volume film. Streaky opacity in the posterior right lung base compatible with atelectasis or possible pneumonia. Left lung clear. The cardiopericardial silhouette is within normal limits for  size. The visualized bony structures of the thorax are intact. Telemetry leads overlie the chest. IMPRESSION: Posterior right basilar opacity compatible with atelectasis or pneumonia. Electronically Signed   By: Misty Stanley M.D.   On: 09/15/2017 20:07   Dg Abd 1 View  Result Date: 10/04/2017 CLINICAL DATA:  Abdominal pain, distention EXAM: ABDOMEN - 1 VIEW COMPARISON:  CT 09/23/2017 FINDINGS: Large stool burden throughout the colon. Mild gaseous distention of mid abdominal small bowel loops. No free air organomegaly. No acute bony abnormality. IMPRESSION: Continued large stool burden in the colon and mildly prominent mid abdominal small bowel loops. This could reflect ileus or small-bowel  obstruction. Electronically Signed   By: Rolm Baptise M.D.   On: 10/04/2017 09:40   Ct Angio Chest Pe W/cm &/or Wo Cm  Result Date: 09/15/2017 CLINICAL DATA:  Intermittent mid upper abdominal pain for 1 week. Shortness of breath 2 days ago. Right leg pain and left leg swelling yesterday. Lower abdominal pain. Positive D-dimer. Abnormal vaginal bleeding. EXAM: CT ANGIOGRAPHY CHEST CT ABDOMEN AND PELVIS WITH CONTRAST TECHNIQUE: Multidetector CT imaging of the chest was performed using the standard protocol during bolus administration of intravenous contrast. Multiplanar CT image reconstructions and MIPs were obtained to evaluate the vascular anatomy. Multidetector CT imaging of the abdomen and pelvis was performed using the standard protocol during bolus administration of intravenous contrast. CONTRAST:  100 mL Isovue 370 COMPARISON:  CT abdomen and pelvis 06/21/2017 FINDINGS: CTA CHEST FINDINGS Cardiovascular: Satisfactory opacification of the pulmonary arteries to the segmental level. No evidence of pulmonary embolism. Normal heart size. No pericardial effusion. Normal caliber thoracic aorta. No aortic dissection. Focal aortic calcification. Great vessel origins are patent. Mediastinum/Nodes: No enlarged mediastinal, hilar, or axillary lymph nodes. Thyroid gland, trachea, and esophagus demonstrate no significant findings. Lungs/Pleura: Atelectasis in the lung bases. No focal consolidation. No pleural effusions. No pneumothorax. Airways are patent. Musculoskeletal: Degenerative changes in the spine. No destructive bone lesions. Review of the MIP images confirms the above findings. CT ABDOMEN and PELVIS FINDINGS Hepatobiliary: Mild diffuse fatty infiltration of the liver. Gallbladder and bile ducts are unremarkable. Small amount of free fluid around the liver. Pancreas: Unremarkable. No pancreatic ductal dilatation or surrounding inflammatory changes. Spleen: Normal in size without focal abnormality. Small amount  of free fluid around the spleen. Adrenals/Urinary Tract: Subcentimeter cyst in the left kidney. No solid mass identified. No hydronephrosis or hydroureter. Renal nephrograms are symmetrical. No adrenal gland nodules. Bladder is mostly decompressed without evidence of filling defect or significant wall thickening. Stomach/Bowel: Stomach, small bowel, and colon are not abnormally distended. Scattered stool in the colon. Small amount of free fluid is demonstrated in the anterior mesentery. No definite loculation. Possible nodular infiltration in the mesentery and omentum. Vascular/Lymphatic: Aortic atherosclerosis. No enlarged abdominal or pelvic lymph nodes. Distal IVC is focally flattened. No definite thrombus demonstrated in the IVC or iliac veins. Visualized common femoral veins also appear patent. No significant retroperitoneal lymphadenopathy. Prominent lymph nodes in the groin regions bilaterally measuring up to about 1.3 cm in maximal diameter. These are nonspecific and are probably reactive but lymphoproliferative disorder or metastatic disease not entirely excluded. Groin lymph nodes are similar to previous study. Reproductive: Prominent heterogeneous multinodular enlargement of the uterus, measuring up to about 7.8 x 10.3 cm. Many nodules have low-attenuation centrally suggesting multiple necrotic fibroids. Ovaries are not definitively identified but would be difficult to separate from the enlarged uterus. Cervical region appears prominent. Endometrium is not separated from the multiple fibroids. Consider  ultrasound of the pelvis for further characterization. Appearances are similar to the previous study. Other: No free air in the abdomen. Mild edema in the subcutaneous fat. Abdominal wall musculature appears intact. Musculoskeletal: No acute or significant osseous findings. Review of the MIP images confirms the above findings. IMPRESSION: 1. Mild diffuse fatty infiltration of the liver. 2. Small amount of  free fluid in the abdomen likely to represent ascites. Possible nodular infiltration in the mesentery and omentum. Can't entirely exclude pseudomyxoma peritonei. 3. 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 Ovaries are not separately identified. Cervix appears prominent. Suggest ultrasound of the pelvis for further evaluation. 4. Visualized major abdominal and pelvic veins appear patent although there is focal flattening of the distal IVC of nonspecific etiology. 5. Nonspecific prominence of lymph nodes in the groin. 6. Aortic atherosclerosis. 7. No evidence of significant pulmonary embolus. 8. No evidence of active pulmonary disease. Electronically Signed   By: Lucienne Capers M.D.   On: 09/15/2017 22:58   Ct Abdomen Pelvis W Contrast  Result Date: 09/15/2017 CLINICAL DATA:  Intermittent mid upper abdominal pain for 1 week. Shortness of breath 2 days ago. Right leg pain and left leg swelling yesterday. Lower abdominal pain. Positive D-dimer. Abnormal vaginal bleeding. EXAM: CT ANGIOGRAPHY CHEST CT ABDOMEN AND PELVIS WITH CONTRAST TECHNIQUE: Multidetector CT imaging of the chest was performed using the standard protocol during bolus administration of intravenous contrast. Multiplanar CT image reconstructions and MIPs were obtained to evaluate the vascular anatomy. Multidetector CT imaging of the abdomen and pelvis was performed using the standard protocol during bolus administration of intravenous contrast. CONTRAST:  100 mL Isovue 370 COMPARISON:  CT abdomen and pelvis 06/21/2017 FINDINGS: CTA CHEST FINDINGS Cardiovascular: Satisfactory opacification of the pulmonary arteries to the segmental level. No evidence of pulmonary embolism. Normal heart size. No pericardial effusion. Normal caliber thoracic aorta. No aortic dissection. Focal aortic calcification. Great vessel origins are patent. Mediastinum/Nodes: No enlarged mediastinal, hilar, or axillary lymph  nodes. Thyroid gland, trachea, and esophagus demonstrate no significant findings. Lungs/Pleura: Atelectasis in the lung bases. No focal consolidation. No pleural effusions. No pneumothorax. Airways are patent. Musculoskeletal: Degenerative changes in the spine. No destructive bone lesions. Review of the MIP images confirms the above findings. CT ABDOMEN and PELVIS FINDINGS Hepatobiliary: Mild diffuse fatty infiltration of the liver. Gallbladder and bile ducts are unremarkable. Small amount of free fluid around the liver. Pancreas: Unremarkable. No pancreatic ductal dilatation or surrounding inflammatory changes. Spleen: Normal in size without focal abnormality. Small amount of free fluid around the spleen. Adrenals/Urinary Tract: Subcentimeter cyst in the left kidney. No solid mass identified. No hydronephrosis or hydroureter. Renal nephrograms are symmetrical. No adrenal gland nodules. Bladder is mostly decompressed without evidence of filling defect or significant wall thickening. Stomach/Bowel: Stomach, small bowel, and colon are not abnormally distended. Scattered stool in the colon. Small amount of free fluid is demonstrated in the anterior mesentery. No definite loculation. Possible nodular infiltration in the mesentery and omentum. Vascular/Lymphatic: Aortic atherosclerosis. No enlarged abdominal or pelvic lymph nodes. Distal IVC is focally flattened. No definite thrombus demonstrated in the IVC or iliac veins. Visualized common femoral veins also appear patent. No significant retroperitoneal lymphadenopathy. Prominent lymph nodes in the groin regions bilaterally measuring up to about 1.3 cm in maximal diameter. These are nonspecific and are probably reactive but lymphoproliferative disorder or metastatic disease not entirely excluded. Groin lymph nodes are similar to previous study. Reproductive: Prominent heterogeneous multinodular enlargement of the uterus, measuring up  to about 7.8 x 10.3 cm. Many nodules  have low-attenuation centrally suggesting multiple necrotic fibroids. Ovaries are not definitively identified but would be difficult to separate from the enlarged uterus. Cervical region appears prominent. Endometrium is not separated from the multiple fibroids. Consider ultrasound of the pelvis for further characterization. Appearances are similar to the previous study. Other: No free air in the abdomen. Mild edema in the subcutaneous fat. Abdominal wall musculature appears intact. Musculoskeletal: No acute or significant osseous findings. Review of the MIP images confirms the above findings. IMPRESSION: 1. Mild diffuse fatty infiltration of the liver. 2. Small amount of free fluid in the abdomen likely to represent ascites. Possible nodular infiltration in the mesentery and omentum. Can't entirely exclude pseudomyxoma peritonei. 3. 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 Ovaries are not separately identified. Cervix appears prominent. Suggest ultrasound of the pelvis for further evaluation. 4. Visualized major abdominal and pelvic veins appear patent although there is focal flattening of the distal IVC of nonspecific etiology. 5. Nonspecific prominence of lymph nodes in the groin. 6. Aortic atherosclerosis. 7. No evidence of significant pulmonary embolus. 8. No evidence of active pulmonary disease. Electronically Signed   By: Lucienne Capers M.D.   On: 09/15/2017 22:58   Dg Abd 2 Views  Result Date: 09/15/2017 CLINICAL DATA:  Patient states intermittent mid upper abdominal pain x 1 week with constipation. SOB 2 days ago. Hx of HTN. Patient states no known heart, lung, or abdominal conditions or surgical hx. EXAM: ABDOMEN - 2 VIEW COMPARISON:  CT, 06/21/2017 FINDINGS: Normal bowel gas pattern. Mild increase colonic stool burden. No evidence of renal or ureteral stones. Soft tissues are unremarkable. No significant skeletal abnormality. Clear lung bases.  IMPRESSION: 1. No acute findings.  No bowel obstruction. 2. Mild increase colonic stool burden. Electronically Signed   By: Lajean Manes M.D.   On: 09/15/2017 20:07   US Abdomen Limited Ruq  Result Date: 09/15/2017 CLINICAL DATA:  Right upper quadrant pain for 1 week EXAM: ULTRASOUND ABDOMEN LIMITED RIGHT UPPER QUADRANT COMPARISON:  None. FINDINGS: Gallbladder: Gallbladder is decompressed with pseudo wall thickening. Gallbladder sludge is noted. No definitive calculi are seen. No pericholecystic fluid is noted. Common bile duct: Diameter: 2.9 mm. Liver: No focal lesion identified. Within normal limits in parenchymal echogenicity. Portal vein is patent on color Doppler imaging with normal direction of blood flow towards the liver. Minimal perihepatic ascites is noted of uncertain significance. IMPRESSION: Minimal ascites. Gallbladder sludge and evidence of gallbladder decompression. No cholelithiasis is noted. Electronically Signed   By: Inez Catalina M.D.   On: 09/15/2017 19:35    Labs:  CBC: Recent Labs    09/15/17 1832 09/25/17 0903 09/18/2017 0703 09/23/2017 1628 10/04/17 0813  WBC 9.8  --  15.8* 13.9* 13.1*  HGB 10.2* 10.5* 9.0* 8.9* 8.7*  HCT 31.3* 31.0* 29.0* 28.4* 27.6*  PLT 405*  --  334 318 286    COAGS: Recent Labs    09/27/2017 0703  INR 1.10  APTT 21*    BMP: Recent Labs    09/15/17 1832 09/25/17 0903 09/17/2017 0830 09/20/2017 1628 10/04/17 0813  NA 138 139 133*  --  135  K 3.5 3.7 5.6*  --  4.5  CL 99* 98* 94*  --  98*  CO2 27  --  24  --  25  GLUCOSE 126* 110* 114*  --  132*  BUN 16 17 69*  --  65*  CALCIUM 9.4  --  9.4  --  8.9  CREATININE 0.82 0.80 2.66* 2.47* 2.09*  GFRNONAA >60  --  19* 21* 26*  GFRAA >60  --  22* 24* 30*    LIVER FUNCTION TESTS: Recent Labs    09/15/17 1832 09/22/2017 0830 10/04/17 0813  BILITOT 0.6 1.0 0.6  AST 28 37 23  ALT 27 28 22   ALKPHOS 55 53 48  PROT 7.9 7.8 6.9  ALBUMIN 3.0* 2.7* 2.4*    TUMOR MARKERS: No results for  input(s): AFPTM, CEA, CA199, CHROMGRNA in the last 8760 hours.  Assessment and Plan: 57 y.o. female with history of recently diagnosed cervical cancer who was originally scheduled to undergo OP left inguinal lymph node biopsy today. She was admitted to [facility] yesterday with persistent abd pain /ileus/partial SBO. Request now received from gyn/onc to proceed with inguinal lymph node biopsy.Imaging studies have been reviewed by Dr. Laurence Ferrari. Risks and benefits discussed with the patient including, but not limited to bleeding, infection, damage to adjacent structures or low yield requiring additional tests.  All of the patient's questions were answered, patient is agreeable to proceed. Consent signed and in chart.  Procedure tent scheduled for later today; should hold heparin injections until after procedure   Thank you for this interesting consult.  I greatly enjoyed meeting Rebecca [name] and look forward to participating in their care.  A copy of this report was sent to the requesting provider on this date.  Electronically Signed: D. Rowe Robert, PA-C 10/04/2017, 10:34 AM   I spent a total of 25 minutes  in face to face in clinical consultation, greater than 50% of which was counseling/coordinating care for US guided left inguinal lymph node biopsy

## 2017-10-04 NOTE — Consult Note (Signed)
Consult Note: Gyn-Onc  Consult was requested by Rebecca. Starla [name] for the evaluation of Rebecca [name] 57 y.o. [name]  CC:     Chief Complaint  Patient presents with  Stage IV endocervical cancer Severe constipation vs possible partial SBO.  Assessment/Plan:  Rebecca [name]  is a 57 y.o.  year old with clinical stage IV endocervical adenocarcinoma and inability to pass bowel movements.  1/ constipation vs bowel obstruction: she has a non-surgical abdomen and imaging does not suggest an obstruction necessitating surgical intervention (no grossly dilated bowel).  She is passing flatus with no emesis/nausea or belching.  Oral agents and suppositories not successful.  About to have soap suds enema. Feel that it is appropriate to continue attempts to relieve from below.  Delayed transit is likely secondary to opiod analgesia. Obstruction (large bowel) from cervical cancer less likely but a possibility. If this declares itself (progressive distension on radiographic images and colicky abdominal pains) surgical intervention with palliative colostomy could be considered. Could consider paracentesis to help with abdominal distension.   2/ stage IV endocervical cancer. I discussed with the patient her diagnosis, her pathology results and her imaging. I discussed that the treatment for this is chemotherapy. Given that she has minimal bleeding, I believe radiation has a less important role, particularly as she has extensive abdominal carcinomatosis which would be untreated by radiation. Biopsy of groin nodes pending. PET scheduled for 10/11/17 to determine extent of disease. Rebecca [name] will return on 10/08/17 and I will have her see the patient to discuss chemotherapy at that time. We would not entertain chemotherapy until her GI function had improved to the point that surgery was determined to be not an option or not necessary.   Rebecca [name] did not express sadness, anger or distress with her diagnosis.  She asked no questions. I asked her if she wanted to know more about her diagnosis or prognosis, she told me no. I have some concerns that she may not be fully appreciating the extent of disease and poor prognosis associated with this. She is an Rebecca [name] and has some understanding of the implications of stage IV cancer. We will continue to educate her as she feels appropriate and desires in the coming days.    HPI: Rebecca [name] is a very pleasant 57 year old P0 who was seen in consultation at the request of Rebecca. Ernestina [name] for postmenopausal bleeding, discharge, pelvic pain, elevated Ca1 25, and abnormal CT scan.  The patient reports a two-month history of vaginal bleeding that began in February 2019.  It was not heavy bleeding and requiring a panty liner. She was then evaluated with a transvaginal ultrasound on August 29, 2017.  This revealed an anteverted uterus measuring 11 x 7.2 x 7.6 cm with 2 3-4 cm fibroids.  The endometrium was seen with fluid and unable to visualize well during to the multiple myomas.  Bilateral ovaries were not seen.  However in the right adnexa solid cystic mass measuring 4.7 cm which was avascular was seen is unclear if this was an ovary or uterine myoma.  There is no free fluid in the cul-de-sac.  She then developed rapid onset unilateral right lower extremity edema that was significant and she was seen by an urgent care for evaluation and then there was a long ED.  A CT scan of the abdomen and pelvis on 09/15/17  were performed to evaluate for potential cause for right leg pain and swelling.  This revealed no evidence of venous occlusive disease.  However there were prominent lymph nodes in the groin regions bilaterally measuring up to 1.3 cm.  Within the abdomen there is a small amount of fluid around the liver.  There is possible nodular infiltration in the mesentery and omentum.  The uterus was prominent and measured 7.8 x 10.3 cm with multiple fibroids.  The endometrium was difficult  to delineate.  The ovaries were not clearly identified separate from the enlarged uterus.    Ca1 25 was drawn on August 30, 2017 and was elevated at 97.3.  CEA was normal.  The patient reports that her last Pap smear was approximately 5 years ago and was normal.  She denies ever having a prior abnormal Pap smear.  She has had 2 first trimester miscarriages but no full-term pregnancies.  She is otherwise healthy with respect to medical comorbidities with the exception of hypertension.  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  On 09/25/17 she underwent exam under anesthesia and cervical biopsies. A very locally advanced cervical cancer was appreciated extending along the right uterosacral ligament to the sidewall.  Pathology revealed a poorly differentiated adenocarcinoma. Immunohistochemical stains show that the tumor cells are positive for p16 and vimentin; while they are negative for estrogen receptor and CEA. This immunohistochemical profile may favor an endocervical primary but is not diagnostic.  Interval Hx:  On 09/25/2017 she was admitted to [facility] with inability to pass a bowel movement. She felt full and distended, and was passing gas but could not have a BM even with aggressive laxative use at home. Golytely 1 gallon did not yield results.  She denies abdominal pain. She continues to have back and leg pain. She denies significant bleeding.  She has had no emesis and denies nausea or belching.   CT abd/pelvis on 10/08/2017 showed large stool burden in the colon, particularly the right colon and transverse colon. Mild distention of small bowel loops in the upper abdomen and decompressed small bowel loops distally. Findings could reflect low grade small bowel obstruction. Borderline inguinal lymph nodes bilaterally. Index left inguinal lymph node has a short axis diameter of 11 mm. Similarly sized bilateral inguinal lymph nodes. Previously seen heterogeneous enhancement and  multiple focal masses throughout the uterus is not as well visualized on this unenhanced study. No definite visible adnexal mass. Moderate free fluid in the abdomen and pelvis, increasing since prior study. There is stranding and thickening of the omentum in the left anterior abdomen concerning for omental/peritoneal disease/caking.  On 10/04/17 she underwent ultrasound guided inguinal node biopsy.   Current Meds:      Outpatient Encounter Medications as of 09/19/2017  Medication Sig  . lisinopril-hydrochlorothiazide (PRINZIDE,ZESTORETIC) 20-12.5 MG tablet Take 1 tablet by mouth daily.  . Multiple Vitamin (MULTIVITAMIN) tablet Take 1 tablet by mouth daily.  Marland Kitchen oxyCODONE (OXY IR/ROXICODONE) 5 MG immediate release tablet Take 1 tablet (5 mg total) by mouth every 4 (four) hours as needed for up to 7 days for severe pain.  . [DISCONTINUED] lisinopril (PRINIVIL,ZESTRIL) 20 MG tablet Take by mouth daily.   No facility-administered encounter medications on file as of 09/19/2017.     Allergy: No Known Allergies  Social Hx:   Social History        Socioeconomic History  . Marital status: Single    Spouse name: Not on file  . Number of children: Not on file  . Years of education: Not on file  . Highest education level: Not on file  Occupational  History  . Not on file  Social Needs  . Financial resource strain: Not on file  . Food insecurity:    Worry: Not on file    Inability: Not on file  . Transportation needs:    Medical: Not on file    Non-medical: Not on file  Tobacco Use  . Smoking status: Never Smoker  . Smokeless tobacco: Never Used  Substance and Sexual Activity  . Alcohol use: Never    Frequency: Never  . Drug use: Never  . Sexual activity: Not Currently    Birth control/protection: Post-menopausal  Lifestyle  . Physical activity:    Days per week: Not on file    Minutes per session: Not on file  . Stress: Not on file  Relationships  . Social  connections:    Talks on phone: Not on file    Gets together: Not on file    Attends religious service: Not on file    Active member of club or organization: Not on file    Attends meetings of clubs or organizations: Not on file    Relationship status: Not on file  . Intimate partner violence:    Fear of current or ex partner: Not on file    Emotionally abused: Not on file    Physically abused: Not on file    Forced sexual activity: Not on file  Other Topics Concern  . Not on file  Social History Narrative  . Not on file    Past Surgical Hx:       Past Surgical History:  Procedure Laterality Date  . DILATION AND CURETTAGE OF UTERUS  2000   SAB    Past Medical Hx:      Past Medical History:  Diagnosis Date  . Fibroids   . Hypertension     Past Gynecological History:  [id number] No LMP recorded. Patient is postmenopausal.  Family Hx:       Family History  Problem Relation Age of Onset  . Hypertension Mother   . Breast cancer Maternal Aunt 50    Review of Systems:  Constitutional  Feels well,    ENT Normal appearing ears and nares bilaterally Skin/Breast  No rash, sores, jaundice, itching, dryness Cardiovascular  No chest pain, shortness of breath, or edema  Pulmonary  No cough or wheeze.  Gastro Intestinal  1. No nausea, vomitting, + constipation. Denies abdominal pain Genito Urinary  No frequency, urgency, dysuria, + bleeding and discharge + pelvic pain Musculo Skeletal  + right lower extremity edema - fluctuates Neurologic  No weakness, numbness, change in gait,  Psychology  No depression, anxiety, insomnia.   Vitals:  Blood pressure (!) 146/75, pulse (!) 118, temperature 98.3 F (36.8 C), temperature source Oral, resp. rate 20, height 5\' 7"  (1.702 m), weight 185 lb 4.8 oz (84.1 kg), SpO2 100 %.  Physical Exam: WD in NAD appears comfortable Neck  Supple NROM, without any enlargements.  Lymph Node Survey +  bilateral palpable inguinal lymphadenopathy Cardiovascular  Pulse normal rate, regularity and rhythm. S1 and S2 normal.  Lungs  Clear to auscultation bilateraly, without wheezes/crackles/rhonchi. Good air movement.  Skin  No rash/lesions/breakdown  Psychiatry  Alert and oriented to person, place, and time  Abdomen  Distended, dull to percuss, fluid wave present. Nontender. No rebound or guarding. hernia. No palpable masses Back No CVA tenderness Genito Urinary: deferred Rectal  deferred Extremities  Very subtle 1+ edema on the right. Warm, well perfused.    Lenard Simmer  Denman George, MD

## 2017-10-05 ENCOUNTER — Observation Stay (HOSPITAL_COMMUNITY): Payer: 59

## 2017-10-05 DIAGNOSIS — K56609 Unspecified intestinal obstruction, unspecified as to partial versus complete obstruction: Secondary | ICD-10-CM | POA: Diagnosis not present

## 2017-10-05 DIAGNOSIS — E669 Obesity, unspecified: Secondary | ICD-10-CM | POA: Diagnosis present

## 2017-10-05 DIAGNOSIS — I10 Essential (primary) hypertension: Secondary | ICD-10-CM | POA: Diagnosis present

## 2017-10-05 DIAGNOSIS — D630 Anemia in neoplastic disease: Secondary | ICD-10-CM | POA: Diagnosis present

## 2017-10-05 DIAGNOSIS — N179 Acute kidney failure, unspecified: Secondary | ICD-10-CM | POA: Diagnosis not present

## 2017-10-05 DIAGNOSIS — R102 Pelvic and perineal pain: Secondary | ICD-10-CM | POA: Diagnosis present

## 2017-10-05 DIAGNOSIS — E860 Dehydration: Secondary | ICD-10-CM | POA: Diagnosis present

## 2017-10-05 DIAGNOSIS — E875 Hyperkalemia: Secondary | ICD-10-CM | POA: Diagnosis present

## 2017-10-05 DIAGNOSIS — R188 Other ascites: Secondary | ICD-10-CM | POA: Diagnosis present

## 2017-10-05 DIAGNOSIS — I469 Cardiac arrest, cause unspecified: Secondary | ICD-10-CM | POA: Diagnosis not present

## 2017-10-05 DIAGNOSIS — R180 Malignant ascites: Secondary | ICD-10-CM | POA: Diagnosis not present

## 2017-10-05 DIAGNOSIS — E785 Hyperlipidemia, unspecified: Secondary | ICD-10-CM | POA: Diagnosis present

## 2017-10-05 DIAGNOSIS — I700 Atherosclerosis of aorta: Secondary | ICD-10-CM | POA: Diagnosis present

## 2017-10-05 DIAGNOSIS — K56600 Partial intestinal obstruction, unspecified as to cause: Secondary | ICD-10-CM

## 2017-10-05 DIAGNOSIS — K5641 Fecal impaction: Secondary | ICD-10-CM | POA: Diagnosis present

## 2017-10-05 DIAGNOSIS — Z803 Family history of malignant neoplasm of breast: Secondary | ICD-10-CM | POA: Diagnosis not present

## 2017-10-05 DIAGNOSIS — C786 Secondary malignant neoplasm of retroperitoneum and peritoneum: Secondary | ICD-10-CM | POA: Diagnosis present

## 2017-10-05 DIAGNOSIS — Z6829 Body mass index (BMI) 29.0-29.9, adult: Secondary | ICD-10-CM | POA: Diagnosis not present

## 2017-10-05 DIAGNOSIS — C530 Malignant neoplasm of endocervix: Secondary | ICD-10-CM | POA: Diagnosis present

## 2017-10-05 LAB — CBC WITH DIFFERENTIAL/PLATELET
BASOS ABS: 0 10*3/uL (ref 0.0–0.1)
Basophils Relative: 0 %
Eosinophils Absolute: 0.1 10*3/uL (ref 0.0–0.7)
Eosinophils Relative: 1 %
HEMATOCRIT: 28.3 % — AB (ref 36.0–46.0)
Hemoglobin: 8.9 g/dL — ABNORMAL LOW (ref 12.0–15.0)
LYMPHS PCT: 12 %
Lymphs Abs: 1.6 10*3/uL (ref 0.7–4.0)
MCH: 26 pg (ref 26.0–34.0)
MCHC: 31.4 g/dL (ref 30.0–36.0)
MCV: 82.7 fL (ref 78.0–100.0)
MONO ABS: 0.8 10*3/uL (ref 0.1–1.0)
Monocytes Relative: 6 %
NEUTROS ABS: 10.6 10*3/uL — AB (ref 1.7–7.7)
Neutrophils Relative %: 81 %
Platelets: 306 10*3/uL (ref 150–400)
RBC: 3.42 MIL/uL — ABNORMAL LOW (ref 3.87–5.11)
RDW: 14.8 % (ref 11.5–15.5)
WBC: 13.1 10*3/uL — ABNORMAL HIGH (ref 4.0–10.5)

## 2017-10-05 LAB — COMPREHENSIVE METABOLIC PANEL
ALK PHOS: 48 U/L (ref 38–126)
ALT: 24 U/L (ref 14–54)
AST: 27 U/L (ref 15–41)
Albumin: 2.4 g/dL — ABNORMAL LOW (ref 3.5–5.0)
Anion gap: 13 (ref 5–15)
BILIRUBIN TOTAL: 0.4 mg/dL (ref 0.3–1.2)
BUN: 56 mg/dL — AB (ref 6–20)
CALCIUM: 8.9 mg/dL (ref 8.9–10.3)
CO2: 23 mmol/L (ref 22–32)
CREATININE: 1.5 mg/dL — AB (ref 0.44–1.00)
Chloride: 103 mmol/L (ref 101–111)
GFR calc Af Amer: 45 mL/min — ABNORMAL LOW (ref >60)
GFR calc non Af Amer: 38 mL/min — ABNORMAL LOW (ref >60)
Glucose, Bld: 128 mg/dL — ABNORMAL HIGH (ref 65–99)
Potassium: 4.5 mmol/L (ref 3.5–5.1)
Sodium: 139 mmol/L (ref 135–145)
TOTAL PROTEIN: 7.2 g/dL (ref 6.5–8.1)

## 2017-10-05 LAB — MAGNESIUM: MAGNESIUM: 2.3 mg/dL (ref 1.7–2.4)

## 2017-10-05 MED ORDER — SIMETHICONE 80 MG PO CHEW: 80.0000 mg | CHEWABLE_TABLET | Freq: Four times a day (QID) | ORAL | Status: DC | PRN

## 2017-10-05 MED ORDER — MORPHINE SULFATE (PF) 2 MG/ML IV SOLN
2.0000 mg | INTRAVENOUS | Status: DC | PRN
  Administered 2017-10-06 – 2017-10-07 (×15): 2 mg via INTRAVENOUS
  Filled 2017-10-05 (×15): qty 1

## 2017-10-05 NOTE — Progress Notes (Signed)
Patient ID: Rebecca [name], female   DOB: [birth year], 57 y.o.   MRN: [id number]  PROGRESS NOTE    Tationa Stech  EUM:[id number] DOB: 01-10-1961 DOA: 09/14/2017 PCP: Nicholos Johns, MD   Brief Narrative:  57 year old female with right adnexal mass and postmenopausal bleeding status post recent cervical biopsy under anesthesia on 09/25/2017 and subsequent pathology showing adenocarcinoma presented on 09/18/2017 with bloating/abdominal discomfort and constipation.  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   Assessment & Plan:   Active Problems:   SBO (small bowel obstruction) (HCC)   Fecal impaction (HCC)   Small bowel obstruction -Probably secondary to metastatic disease -Continue IV fluids and pain management.  Currently not nauseous or vomiting so will hold off on NG tube.   -General surgery following.  Patient received enema yesterday but still has not had a bowel movement yet  Recent diagnosis of adenocarcinoma of cervix with probable omental metastatic disease and ascites -GYN oncology evaluation appreciated.  Dr. Alvy Bimler will evaluate the patient on Monday -Might need paracentesis if abdominal distention worsens once patient starts having bowel movements. -Status post ultrasound-guided left inguinal lymph node biopsy by IR on 10/04/2017   Leukocytosis -Probably reactive.  Repeat a.m. Labs  Hyperkalemia -Resolved.  Repeat a.m. Labs  Acute kidney injury -Probably secondary to poor oral intake and dehydration -Improving.  Continue IV fluid hydration.  Repeat a.m. 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 Denman George  Procedures:  ultrasound-guided left inguinal lymph node biopsy by IR  on 10/04/2017   Antimicrobials: None   Subjective: Patient seen and examined at bedside.  She has not had any bowel movement overnight.  No worsening abdominal pain or vomiting.  No current nausea.  No overnight fever.  Objective: Vitals:   10/04/17 1420 10/04/17 1425 10/04/17 2136 10/05/17 0545  BP: 114/71 95/62 114/66 116/63  Pulse: (!) 124 (!) 116 (!) 113 (!) 110  Resp: (!) 23 (!) 21 18 18   Temp:   98.9 F (37.2 C) 98.3 F (36.8 C)  TempSrc:   Oral Oral  SpO2: 100% 98% 100% 95%  Weight:      Height:        Intake/Output Summary (Last 24 hours) at 10/05/2017 1253 Last data filed at 10/05/2017 0926 Gross per 24 hour  Intake 1500 ml  Output -  Net 1500 ml   Filed Weights   10/06/2017 0419  Weight: 85.3 kg (188 lb)    Examination:  General exam: Appears calm and comfortable.  No distress Respiratory system: Bilateral decreased breath sounds at bases Cardiovascular system: S1 & S2 heard, rate controlled  gastrointestinal system: Abdomen is distended, soft and nontender.  Bowel sounds still sluggish  Extremities: No cyanosis, clubbing; 2+ bilateral pitting edema  Data Reviewed: I have personally reviewed following labs and imaging studies  CBC: Recent Labs  Lab 10/08/2017 0703 09/22/2017 1628 10/04/17 0813 10/05/17 0452  WBC 15.8* 13.9* 13.1* 13.1*  NEUTROABS 12.8*  --  10.8* 10.6*  HGB 9.0* 8.9* 8.7* 8.9*  HCT 29.0* 28.4* 27.6* 28.3*  MCV 83.3 83.0 83.4 82.7  PLT 334 318 286 540   Basic Metabolic Panel: Recent Labs  Lab 09/13/2017 0830 09/29/2017 1628 10/04/17 0813 10/05/17 0452  NA 133*  --  135 139  K 5.6*  --  4.5 4.5  CL 94*  --  98* 103  CO2 24  --  25 23  GLUCOSE 114*  --  132* 128*  BUN 69*  --  65* 56*  CREATININE 2.66* 2.47* 2.09* 1.50*  CALCIUM 9.4  --  8.9 8.9  MG  --   --  2.3 2.3   GFR: Estimated Creatinine Clearance: 48.1 mL/min (A) (by C-G formula based on SCr of 1.5 mg/dL (H)). Liver Function Tests: Recent Labs  Lab 09/27/2017 0830  10/04/17 0813 10/05/17 0452  AST 37 23 27  ALT 28 22 24   ALKPHOS 53 48 48  BILITOT 1.0 0.6 0.4  PROT 7.8 6.9 7.2  ALBUMIN 2.7* 2.4* 2.4*   No results for input(s): LIPASE, AMYLASE in the last 168 hours. No results for input(s): AMMONIA in the last 168 hours. Coagulation Profile: Recent Labs  Lab 10/02/2017 0703  INR 1.10   Cardiac Enzymes: No results for input(s): CKTOTAL, CKMB, CKMBINDEX, TROPONINI in the last 168 hours. BNP (last 3 results) No results for input(s): PROBNP in the last 8760 hours. HbA1C: No results for input(s): HGBA1C in the last 72 hours. CBG: No results for input(s): GLUCAP in the last 168 hours. Lipid Profile: No results for input(s): CHOL, HDL, LDLCALC, TRIG, CHOLHDL, LDLDIRECT in the last 72 hours. Thyroid Function Tests: No results for input(s): TSH, T4TOTAL, FREET4, T3FREE, THYROIDAB in the last 72 hours. Anemia Panel: No results for input(s): VITAMINB12, FOLATE, FERRITIN, TIBC, IRON, RETICCTPCT in the last 72 hours. Sepsis Labs: No results for input(s): PROCALCITON, LATICACIDVEN in the last 168 hours.  No results found for this or any previous visit (from the past 240 hour(s)).       Radiology Studies: Ct Abdomen Pelvis Wo Contrast  Result Date: 09/24/2017 CLINICAL DATA:  Abdominal distention, pelvic pain.  Cervical cancer. EXAM: CT ABDOMEN AND PELVIS WITHOUT CONTRAST TECHNIQUE: Multidetector CT imaging of the abdomen and pelvis was performed following the standard protocol without IV contrast. COMPARISON:  09/15/2017 FINDINGS: Lower chest: Increasing bibasilar airspace opacities, left greater than right, likely atelectasis although pneumonia cannot be excluded, particularly at the left base. No effusions. Heart is normal size. Hepatobiliary: No focal hepatic abnormality. Layering slightly high-density material noted in the gallbladder could reflect sludge or stones. Pancreas: No focal abnormality or ductal dilatation. Spleen: No focal abnormality.   Normal size. Adrenals/Urinary Tract: Small hyperdense area within the midpole of the left kidney, likely small hemorrhagic cyst. No hydronephrosis. Punctate calcification in the lower pole of the right kidney. Adrenal glands unremarkable. Urinary bladder decompressed, grossly unremarkable. Stomach/Bowel: Large stool burden in the colon, particularly the right colon and transverse colon. Mild distention of small bowel loops in the upper abdomen and decompressed small bowel loops distally. Findings could reflect low grade small bowel obstruction. Vascular/Lymphatic: Scattered aortic calcifications. No aneurysm or adenopathy. Borderline inguinal lymph nodes bilaterally. Index left inguinal lymph node has a short axis diameter of 11 mm on image 84. Similarly sized bilateral inguinal lymph nodes. Reproductive: Previously seen heterogeneous enhancement and multiple focal masses throughout the uterus is not as well visualized on this unenhanced study. No definite visible adnexal mass. Other: Moderate free fluid in the abdomen and pelvis, increasing since prior study. There is stranding and thickening of the omentum in the left anterior abdomen concerning for omental/peritoneal disease/caking. Musculoskeletal: Sclerosis within the right iliac bone adjacent to the SI joint may be related to sacroiliitis. This is stable. No acute bony abnormality. IMPRESSION: The complex uterine/pelvic process seen on  prior contrast enhanced study is not as well visualized on today's unenhanced study. There is increasing ascites in the abdomen and pelvis, and increasing omental thickening/stranding concerning for mental metastatic disease/caking. Large amount of stool in the right colon and transverse colon. Prominent upper abdominal small bowel loops. Distal small bowel loops are decompressed. Findings could reflect low grade partial small bowel obstruction. Exact cause or transition not visible. Bibasilar atelectasis or infiltrates, left  greater than right. Electronically Signed   By: Rolm Baptise M.D.   On: 10/05/2017 13:49   Dg Abd 1 View  Result Date: 10/04/2017 CLINICAL DATA:  Abdominal pain, distention EXAM: ABDOMEN - 1 VIEW COMPARISON:  CT 10/08/2017 FINDINGS: Large stool burden throughout the colon. Mild gaseous distention of mid abdominal small bowel loops. No free air organomegaly. No acute bony abnormality. IMPRESSION: Continued large stool burden in the colon and mildly prominent mid abdominal small bowel loops. This could reflect ileus or small-bowel obstruction. Electronically Signed   By: Rolm Baptise M.D.   On: 10/04/2017 09:40   Dg Abd 2 Views  Result Date: 10/05/2017 CLINICAL DATA:  Abdominal distention and pain. EXAM: ABDOMEN - 2 VIEW COMPARISON:  Abdominal x-ray from yesterday. FINDINGS: Persistent mild dilatation of central small bowel loops in the mid abdomen with air-fluid levels. Findings are similar to prior study. Large colonic stool burden again noted. No pneumoperitoneum. No acute osseous abnormality. IMPRESSION: 1. Unchanged mild central small bowel dilatation. Electronically Signed   By: Titus Dubin M.D.   On: 10/05/2017 09:29   [place] Core Biopsy (lymph Nodes)  Result Date: 10/04/2017 INDICATION: 57 year old female with cervical cancer and enlarged left inguinal lymph nodes concerning for metastatic disease EXAM: Ultrasound-guided core biopsy of lymph nodes MEDICATIONS: None. ANESTHESIA/SEDATION: Moderate (conscious) sedation was employed during this procedure. A total of Versed 2 mg and Fentanyl 100 mcg was administered intravenously. Moderate Sedation Time: 14 minutes minutes. The patient's level of consciousness and vital signs were monitored continuously by radiology nursing throughout the procedure under my direct supervision. FLUOROSCOPY TIME:  Fluoroscopy Time: 0 minutes 0 seconds (0 mGy). COMPLICATIONS: None immediate. PROCEDURE: Informed written consent was obtained from the patient after a thorough  discussion of the procedural risks, benefits and alternatives. All questions were addressed. Maximal Sterile Barrier Technique was utilized including caps, mask, sterile gowns, sterile gloves, sterile drape, hand hygiene and skin antiseptic. A timeout was performed prior to the initiation of the procedure. The left inguinal region was interrogated with ultrasound. Several prominent hypoechoic lymph nodes are identified. A suitable lymph node for biopsy was selected. The overlying skin was prepped and draped in standard fashion using chlorhexidine skin prep. Local anesthesia was attained by infiltration with 1% lidocaine. A small dermatotomy was made. Under real-time sonographic guidance, multiple 18 gauge core biopsies were coaxially obtained using the bio Pince automated biopsy device. Biopsy specimens were placed in saline and delivered to pathology for further analysis. Post biopsy ultrasound imaging demonstrates no evidence of immediate complication. IMPRESSION: Ultrasound-guided core biopsy left inguinal lymph nodes. Electronically Signed   By: Jacqulynn Cadet M.D.   On: 10/04/2017 14:48        Scheduled Meds: . heparin  5,000 Units Subcutaneous Q8H  . multivitamin with minerals  1 tablet Oral Daily   Continuous Infusions: . dextrose 5 % and 0.9% NaCl 125 mL/hr at 10/05/17 0738     LOS: 0 days        Aline August, MD [facility] Pager [phone]  If 7PM-7AM, please contact  night-coverage [email or web address] Password [facility] 10/05/2017, 12:53 PM

## 2017-10-05 NOTE — Progress Notes (Signed)
    GT:[id number] discomfort  Subjective: She still feels bad, no results with SSE.  She feels bloated and is feeling pretty low right now.  Worried about all the fluid she is getting.    Objective: Vital signs in last 24 hours: Temp:  [98.3 F (36.8 C)-98.9 F (37.2 C)] 98.3 F (36.8 C) (04/26 0545) Pulse Rate:  [110-126] 110 (04/26 0545) Resp:  [18-26] 18 (04/26 0545) BP: (95-127)/(62-74) 116/63 (04/26 0545) SpO2:  [95 %-100 %] 95 % (04/26 0545) Last BM Date: 09/18/17 NPO x ice chips - nothing po recorded 1500 IV Urine x 4 No BM recorded Afebrile, VSS Creatinine is down some, WBC stable Films pending Intake/Output from previous day: 04/25 0701 - 04/26 0700 In: 1500 [I.V.:1500] Out: -  Intake/Output this shift: No intake/output data recorded.  General appearance: alert, cooperative, no distress and still feels terrible GI: still distended and uncomfortable.  Lab Results:  Recent Labs    10/04/17 0813 10/05/17 0452  WBC 13.1* 13.1*  HGB 8.7* 8.9*  HCT 27.6* 28.3*  PLT 286 306    BMET Recent Labs    10/04/17 0813 10/05/17 0452  NA 135 139  K 4.5 4.5  CL 98* 103  CO2 25 23  GLUCOSE 132* 128*  BUN 65* 56*  CREATININE 2.09* 1.50*  CALCIUM 8.9 8.9   PT/INR Recent Labs    09/26/2017 0703  LABPROT 14.1  INR 1.10    Recent Labs  Lab 09/12/2017 0830 10/04/17 0813 10/05/17 0452  AST 37 23 27  ALT 28 22 24   ALKPHOS 53 48 48  BILITOT 1.0 0.6 0.4  PROT 7.8 6.9 7.2  ALBUMIN 2.7* 2.4* 2.4*     Lipase     Component Value Date/Time   LIPASE 39 09/15/2017 1832     Medications: . heparin  5,000 Units Subcutaneous Q8H  . multivitamin with minerals  1 tablet Oral Daily   . dextrose 5 % and 0.9% NaCl 125 mL/hr at 10/05/17 2248    Assessment/Plan Acute renal failure/dehydration Hypertension Lower extremity swelling and edema -with negative DVT vascular study 09/15/17  Possible small bowel obstruction Severe constipation x2 to 3 weeks Poorly  differentiated cervical carcinoma with possible peritoneal, omental, and mesenteric nodular metastasis.   Exam under anesthesia/cervical biopsy 09/25/2017 Dr. Everitt Amber  FEN:  IV fluids/ice chips - sips ID:  None DVT:  Heparin Follow up:  Dr. Rossi/Oncology    Plan:  We discussed with Dr. Denman George last PM and she will follow patient and call if she needs our assistance in the future.       LOS: 0 days    Rebecca [name] 10/05/2017 [phone]

## 2017-10-06 LAB — CBC WITH DIFFERENTIAL/PLATELET
BASOS ABS: 0 10*3/uL (ref 0.0–0.1)
Basophils Relative: 0 %
EOS ABS: 0.1 10*3/uL (ref 0.0–0.7)
EOS PCT: 1 %
HCT: 29 % — ABNORMAL LOW (ref 36.0–46.0)
Hemoglobin: 9 g/dL — ABNORMAL LOW (ref 12.0–15.0)
LYMPHS ABS: 1.5 10*3/uL (ref 0.7–4.0)
LYMPHS PCT: 12 %
MCH: 26.2 pg (ref 26.0–34.0)
MCHC: 31 g/dL (ref 30.0–36.0)
MCV: 84.3 fL (ref 78.0–100.0)
MONO ABS: 0.7 10*3/uL (ref 0.1–1.0)
Monocytes Relative: 6 %
Neutro Abs: 9.9 10*3/uL — ABNORMAL HIGH (ref 1.7–7.7)
Neutrophils Relative %: 81 %
Platelets: 291 10*3/uL (ref 150–400)
RBC: 3.44 MIL/uL — AB (ref 3.87–5.11)
RDW: 14.9 % (ref 11.5–15.5)
WBC: 12.2 10*3/uL — AB (ref 4.0–10.5)

## 2017-10-06 LAB — BASIC METABOLIC PANEL
ANION GAP: 11 (ref 5–15)
BUN: 42 mg/dL — ABNORMAL HIGH (ref 6–20)
CHLORIDE: 105 mmol/L (ref 101–111)
CO2: 22 mmol/L (ref 22–32)
Calcium: 8.7 mg/dL — ABNORMAL LOW (ref 8.9–10.3)
Creatinine, Ser: 1.2 mg/dL — ABNORMAL HIGH (ref 0.44–1.00)
GFR calc non Af Amer: 50 mL/min — ABNORMAL LOW (ref >60)
GFR, EST AFRICAN AMERICAN: 58 mL/min — AB (ref >60)
Glucose, Bld: 134 mg/dL — ABNORMAL HIGH (ref 65–99)
POTASSIUM: 4.3 mmol/L (ref 3.5–5.1)
Sodium: 138 mmol/L (ref 135–145)

## 2017-10-06 LAB — MAGNESIUM: Magnesium: 2.1 mg/dL (ref 1.7–2.4)

## 2017-10-06 MED ORDER — SENNOSIDES-DOCUSATE SODIUM 8.6-50 MG PO TABS
1.0000 | ORAL_TABLET | Freq: Two times a day (BID) | ORAL | Status: DC
  Administered 2017-10-06 – 2017-10-07 (×3): 1 via ORAL
  Filled 2017-10-06 (×3): qty 1

## 2017-10-06 MED ORDER — POLYETHYLENE GLYCOL 3350 17 G PO PACK: 17.0000 g | PACK | Freq: Two times a day (BID) | ORAL | Status: DC

## 2017-10-06 MED ORDER — POLYETHYLENE GLYCOL 3350 17 G PO PACK
17.0000 g | PACK | Freq: Every day | ORAL | Status: DC
  Administered 2017-10-06: 17 g via ORAL
  Filled 2017-10-06: qty 1

## 2017-10-06 NOTE — Progress Notes (Signed)
Patient ID: Rebecca [name], female   DOB: April 18, 1961, 57 y.o.   MRN: [id number]  PROGRESS NOTE    Leonia Heatherly  XKG:[id number] DOB: December 29, 1960 DOA: 10/07/2017 PCP: Nicholos Johns, MD   Brief Narrative:  57 year old female with right adnexal mass and postmenopausal bleeding status post recent cervical biopsy under anesthesia on 09/25/2017 and subsequent pathology showing adenocarcinoma presented on 10/02/2017 with bloating/abdominal discomfort and constipation.  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   Assessment & Plan:   Active Problems:   SBO (small bowel obstruction) (HCC)   Fecal impaction (HCC)   Small bowel obstruction -Probably secondary to metastatic disease -Continue IV fluids and pain management.  Currently not nauseous or vomiting so will hold off on NG tube.   -General surgery and GYN oncology following.  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  Dr. Alvy Bimler will evaluate the patient on Monday -Might need paracentesis if abdominal distention worsens once patient starts having bowel movements. -Status post ultrasound-guided left inguinal lymph node biopsy by IR on 10/04/2017.  Pathology pending  Leukocytosis -Probably reactive.  Improving.  Repeat a.m. Labs  Hyperkalemia -Resolved.  Repeat a.m. Labs  Acute kidney injury -Probably secondary to poor oral intake and dehydration -Improving.  Continue IV fluid hydration.  Repeat a.m. 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 Denman George  Procedures:    ultrasound-guided left inguinal lymph node biopsy by IR on 10/04/2017   Antimicrobials: None   Subjective: Patient seen and examined at bedside.  She is complaining of worsening abdominal pain.  No vomiting.  She is frustrated with her current medical condition and no improvement.  No overnight fever.  Objective: Vitals:   10/05/17 0545 10/05/17 1439 10/05/17 2136 10/06/17 0531  BP: 116/63 103/68 119/66 111/72  Pulse: (!) 110 (!) 111 (!) 115 (!) 116  Resp: 18 18 14 16   Temp: 98.3 F (36.8 C)  99.1 F (37.3 C) 98.6 F (37 C)  TempSrc: Oral  Oral Oral  SpO2: 95% 97% 92%   Weight:      Height:        Intake/Output Summary (Last 24 hours) at 10/06/2017 1133 Last data filed at 10/06/2017 0541 Gross per 24 hour  Intake 771.67 ml  Output -  Net 771.67 ml   Filed Weights   10/04/2017 0419  Weight: 85.3 kg (188 lb)    Examination:  General exam: Appears anxious and frustrated.   Respiratory system: Bilateral decreased breath sounds at bases Cardiovascular system: S1-S2 heard, rate controlled  gastrointestinal system: Abdomen is distended, soft with mild diffuse tenderness.  No bowel sounds heard  extremities: No cyanosis, clubbing; 2+ bilateral pitting edema  Data Reviewed: I have personally reviewed following labs and imaging studies  CBC: Recent Labs  Lab 09/22/2017 0703 09/30/2017 1628 10/04/17 0813 10/05/17 0452 10/06/17 0430  WBC 15.8* 13.9* 13.1* 13.1* 12.2*  NEUTROABS 12.8*  --  10.8* 10.6* 9.9*  HGB 9.0* 8.9* 8.7* 8.9* 9.0*  HCT 29.0* 28.4* 27.6* 28.3* 29.0*  MCV 83.3 83.0 83.4 82.7 84.3  PLT 334 318 286 306 702   Basic Metabolic Panel: Recent Labs  Lab 09/16/2017 0830 09/20/2017 1628 10/04/17 0813 10/05/17 0452 10/06/17 0430  NA 133*  --  135 139 138  K 5.6*  --  4.5 4.5 4.3  CL 94*  --  98* 103 105  CO2 24  --  25 23 22   GLUCOSE 114*  --  132* 128* 134*  BUN 69*  --  65* 56* 42*  CREATININE 2.66* 2.47* 2.09* 1.50* 1.20*  CALCIUM 9.4  --  8.9 8.9 8.7*   MG  --   --  2.3 2.3 2.1   GFR: Estimated Creatinine Clearance: 60.2 mL/min (A) (by C-G formula based on SCr of 1.2 mg/dL (H)). Liver Function Tests: Recent Labs  Lab 09/29/2017 0830 10/04/17 0813 10/05/17 0452  AST 37 23 27  ALT 28 22 24   ALKPHOS 53 48 48  BILITOT 1.0 0.6 0.4  PROT 7.8 6.9 7.2  ALBUMIN 2.7* 2.4* 2.4*   No results for input(s): LIPASE, AMYLASE in the last 168 hours. No results for input(s): AMMONIA in the last 168 hours. Coagulation Profile: Recent Labs  Lab 09/10/2017 0703  INR 1.10   Cardiac Enzymes: No results for input(s): CKTOTAL, CKMB, CKMBINDEX, TROPONINI in the last 168 hours. BNP (last 3 results) No results for input(s): PROBNP in the last 8760 hours. HbA1C: No results for input(s): HGBA1C in the last 72 hours. CBG: No results for input(s): GLUCAP in the last 168 hours. Lipid Profile: No results for input(s): CHOL, HDL, LDLCALC, TRIG, CHOLHDL, LDLDIRECT in the last 72 hours. Thyroid Function Tests: No results for input(s): TSH, T4TOTAL, FREET4, T3FREE, THYROIDAB in the last 72 hours. Anemia Panel: No results for input(s): VITAMINB12, FOLATE, FERRITIN, TIBC, IRON, RETICCTPCT in the last 72 hours. Sepsis Labs: No results for input(s): PROCALCITON, LATICACIDVEN in the last 168 hours.  No results found for this or any previous visit (from the past 240 hour(s)).       Radiology Studies: Dg Abd 2 Views  Result Date: 10/05/2017 CLINICAL DATA:  Abdominal distention and pain. EXAM: ABDOMEN - 2 VIEW COMPARISON:  Abdominal x-ray from yesterday. FINDINGS: Persistent mild dilatation of central small bowel loops in the mid abdomen with air-fluid levels. Findings are similar to prior study. Large colonic stool burden again noted. No pneumoperitoneum. No acute osseous abnormality. IMPRESSION: 1. Unchanged mild central small bowel dilatation. Electronically Signed   By: Titus Dubin M.D.   On: 10/05/2017 09:29   [place] Core Biopsy (lymph Nodes)  Result  Date: 10/04/2017 INDICATION: 57 year old female with cervical cancer and enlarged left inguinal lymph nodes concerning for metastatic disease EXAM: Ultrasound-guided core biopsy of lymph nodes MEDICATIONS: None. ANESTHESIA/SEDATION: Moderate (conscious) sedation was employed during this procedure. A total of Versed 2 mg and Fentanyl 100 mcg was administered intravenously. Moderate Sedation Time: 14 minutes minutes. The patient's level of consciousness and vital signs were monitored continuously by radiology nursing throughout the procedure under my direct supervision. FLUOROSCOPY TIME:  Fluoroscopy Time: 0 minutes 0 seconds (0 mGy). COMPLICATIONS: None immediate. PROCEDURE: Informed written consent was obtained from the patient after a thorough discussion of the procedural risks, benefits and alternatives. All questions were addressed. Maximal Sterile Barrier Technique was utilized including caps, mask, sterile gowns, sterile gloves, sterile drape, hand hygiene and skin antiseptic. A timeout was performed prior to the initiation of the procedure. The left inguinal region was interrogated with ultrasound. Several prominent hypoechoic lymph nodes are identified. A suitable lymph node for biopsy was selected. The overlying skin was prepped and draped in standard fashion using  chlorhexidine skin prep. Local anesthesia was attained by infiltration with 1% lidocaine. A small dermatotomy was made. Under real-time sonographic guidance, multiple 18 gauge core biopsies were coaxially obtained using the bio Pince automated biopsy device. Biopsy specimens were placed in saline and delivered to pathology for further analysis. Post biopsy ultrasound imaging demonstrates no evidence of immediate complication. IMPRESSION: Ultrasound-guided core biopsy left inguinal lymph nodes. Electronically Signed   By: Jacqulynn Cadet M.D.   On: 10/04/2017 14:48        Scheduled Meds: . heparin  5,000 Units Subcutaneous Q8H  .  polyethylene glycol  17 g Oral Daily  . senna-docusate  1 tablet Oral BID   Continuous Infusions: . dextrose 5 % and 0.9% NaCl 75 mL/hr at 10/06/17 0549     LOS: 1 day        Aline August, MD [facility] Pager [phone]  If 7PM-7AM, please contact night-coverage [email or web address] Password [facility] 10/06/2017, 11:33 AM

## 2017-10-06 NOTE — Consult Note (Signed)
Consult Note: Gyn-Onc  Consult was requested by Dr. Starla Link for Rebecca evaluation of Rebecca [name] 57 y.o. female  CC:     Chief Complaint  [name] presents with  Stage IV endocervical cancer Severe constipation vs possible partial SBO.  Assessment/Plan:  Rebecca [name]  is a 57 y.o.  year old with clinical stage IV endocervical adenocarcinoma and inability to pass bowel movements.  1/ constipation vs bowel obstruction: she has a non-surgical abdomen and imaging does not suggest an obstruction necessitating surgical intervention (no grossly dilated bowel).  She is passing minimal flatus with no emesis/nausea or belching.  Oral agents and suppositories not successful. But not on anything at present. Will start with miralax in am and sennakot S BID. Would try sorbitol tomorrow if this doesn't work. Will repeat enema tomorrow at [name]'s request. Recommended prune juice.  Delayed transit is likely secondary to opiod analgesia. Obstruction (large bowel) from cervical cancer less likely but a possibility. If this declares itself (progressive distension on radiographic images and colicky abdominal pains) surgical intervention with palliative colostomy could be considered. Could consider paracentesis to help with abdominal distension.   2/ stage IV endocervical cancer. I discussed with Rebecca [name] her diagnosis, her pathology results and her imaging. I discussed that Rebecca treatment for this is chemotherapy. Given that she has minimal bleeding, I believe radiation has a less important role, particularly as she has extensive abdominal carcinomatosis which would be untreated by radiation. Biopsy of groin nodes pending. PET scheduled for 10/11/17 to determine extent of disease. Dr Alvy Bimler will return on 10/08/17 and I will have her see Rebecca [name] to discuss chemotherapy at that time. We would not entertain chemotherapy until her GI function had improved to Rebecca point that surgery was determined to  be not an option or not necessary.   HPI: Rebecca [name] is a very pleasant 57 year old P0 who was seen in consultation at Rebecca request of Dr. Ernestina Patches for postmenopausal bleeding, discharge, pelvic pain, elevated Ca1 25, and abnormal CT scan.  Rebecca [name] reports a two-month history of vaginal bleeding that began in February 2019.  It was not heavy bleeding and requiring a panty liner. She was then evaluated with a transvaginal ultrasound on August 29, 2017.  This revealed an anteverted uterus measuring 11 x 7.2 x 7.6 cm with 2 3-4 cm fibroids.  Rebecca endometrium was seen with fluid and unable to visualize well during to Rebecca multiple myomas.  Bilateral ovaries were not seen.  However in Rebecca right adnexa solid cystic mass measuring 4.7 cm which was avascular was seen is unclear if this was an ovary or uterine myoma.  There is no free fluid in Rebecca cul-de-sac.  She then developed rapid onset unilateral right lower extremity edema that was significant and she was seen by an urgent care for evaluation and then there was a long ED.  A CT scan of Rebecca abdomen and pelvis on 09/15/17  were performed to evaluate for potential cause for right leg pain and swelling.  This revealed no evidence of venous occlusive disease.  However there were prominent lymph nodes in Rebecca groin regions bilaterally measuring up to 1.3 cm.  Within Rebecca abdomen there is a small amount of fluid around Rebecca liver.  There is possible nodular infiltration in Rebecca mesentery and omentum.  Rebecca uterus was prominent and measured 7.8 x 10.3 cm with multiple fibroids.  Rebecca endometrium was difficult to delineate.  Rebecca ovaries were not clearly identified separate from Rebecca enlarged  uterus.    Ca1 25 was drawn on August 30, 2017 and was elevated at 97.3.  CEA was normal.  Rebecca [name] reports that her last Pap smear was approximately 5 years ago and was normal.  She denies ever having a prior abnormal Pap smear.  She has had 2 first trimester miscarriages but no  full-term pregnancies.  She is otherwise healthy with respect to medical comorbidities with Rebecca exception of hypertension.  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  On 09/25/17 she underwent exam under anesthesia and cervical biopsies. A very locally advanced cervical cancer was appreciated extending along Rebecca right uterosacral ligament to Rebecca sidewall.  Pathology revealed a poorly differentiated adenocarcinoma. Immunohistochemical stains show that Rebecca tumor cells are positive for p16 and vimentin; while they are negative for estrogen receptor and CEA. This immunohistochemical profile may favor an endocervical primary but is not diagnostic.  Interval Hx:  On 09/12/2017 she was admitted to [facility] with inability to pass a bowel movement. She felt full and distended, and was passing gas but could not have a BM even with aggressive laxative use at home. Golytely 1 gallon did not yield results.  CT abd/pelvis on 10/02/2017 showed large stool burden in Rebecca colon, particularly Rebecca right colon and transverse colon. Mild distention of small bowel loops in Rebecca upper abdomen and decompressed small bowel loops distally. Findings could reflect low grade small bowel obstruction. Borderline inguinal lymph nodes bilaterally. Index left inguinal lymph node has a short axis diameter of 11 mm. Similarly sized bilateral inguinal lymph nodes. Previously seen heterogeneous enhancement and multiple focal masses throughout Rebecca uterus is not as well visualized on this unenhanced study. No definite visible adnexal mass. Moderate free fluid in Rebecca abdomen and pelvis, increasing since prior study. There is stranding and thickening of Rebecca omentum in Rebecca left anterior abdomen concerning for omental/peritoneal disease/caking.  On 10/04/17 she underwent ultrasound guided inguinal node biopsy.  Despite soap suds enema, no relief of constipation. Tolerating PO but feels "full". She denies abdominal pain. She  continues to have back and leg pain. She denies significant bleeding.  She has had no emesis and denies nausea or belching.    Current Meds:      Outpatient Encounter Medications as of 09/19/2017  Medication Sig  . lisinopril-hydrochlorothiazide (PRINZIDE,ZESTORETIC) 20-12.5 MG tablet Take 1 tablet by mouth daily.  . Multiple Vitamin (MULTIVITAMIN) tablet Take 1 tablet by mouth daily.  Marland Kitchen oxyCODONE (OXY IR/ROXICODONE) 5 MG immediate release tablet Take 1 tablet (5 mg total) by mouth every 4 (four) hours as needed for up to 7 days for severe pain.  . [DISCONTINUED] lisinopril (PRINIVIL,ZESTRIL) 20 MG tablet Take by mouth daily.   No facility-administered encounter medications on file as of 09/19/2017.     Allergy: No Known Allergies  Social Hx:   Social History        Socioeconomic History  . Marital status: Single    Spouse name: Not on file  . Number of children: Not on file  . Years of education: Not on file  . Highest education level: Not on file  Occupational History  . Not on file  Social Needs  . Financial resource strain: Not on file  . Food insecurity:    Worry: Not on file    Inability: Not on file  . Transportation needs:    Medical: Not on file    Non-medical: Not on file  Tobacco Use  . Smoking status:  Never Smoker  . Smokeless tobacco: Never Used  Substance and Sexual Activity  . Alcohol use: Never    Frequency: Never  . Drug use: Never  . Sexual activity: Not Currently    Birth control/protection: Post-menopausal  Lifestyle  . Physical activity:    Days per week: Not on file    Minutes per session: Not on file  . Stress: Not on file  Relationships  . Social connections:    Talks on phone: Not on file    Gets together: Not on file    Attends religious service: Not on file    Active member of club or organization: Not on file    Attends meetings of clubs or organizations: Not on file    Relationship status: Not on  file  . Intimate partner violence:    Fear of current or ex partner: Not on file    Emotionally abused: Not on file    Physically abused: Not on file    Forced sexual activity: Not on file  Other Topics Concern  . Not on file  Social History Narrative  . Not on file    Past Surgical Hx:       Past Surgical History:  Procedure Laterality Date  . DILATION AND CURETTAGE OF UTERUS  2000   SAB    Past Medical Hx:      Past Medical History:  Diagnosis Date  . Fibroids   . Hypertension     Past Gynecological History:  [id number] No LMP recorded. [name] is postmenopausal.  Family Hx:       Family History  Problem Relation Age of Onset  . Hypertension Mother   . Breast cancer Maternal Aunt 50    Review of Systems:  Constitutional  Feels well,    ENT Normal appearing ears and nares bilaterally Skin/Breast  No rash, sores, jaundice, itching, dryness Cardiovascular  No chest pain, shortness of breath, or edema  Pulmonary  No cough or wheeze.  Gastro Intestinal  1. No nausea, vomitting, + constipation. Denies abdominal pain Genito Urinary  No frequency, urgency, dysuria, + bleeding and discharge + pelvic pain Musculo Skeletal  + right lower extremity edema - fluctuates Neurologic  No weakness, numbness, change in gait,  Psychology  No depression, anxiety, insomnia.   Vitals:  Blood pressure (!) 146/75, pulse (!) 118, temperature 98.3 F (36.8 C), temperature source Oral, resp. rate 20, height 5\' 7"  (1.702 m), weight 185 lb 4.8 oz (84.1 kg), SpO2 100 %.  Physical Exam: WD in NAD appears comfortable Neck  Supple NROM, without any enlargements.  Lymph Node Survey + bilateral palpable inguinal lymphadenopathy Cardiovascular  Pulse normal rate, regularity and rhythm. S1 and S2 normal.  Lungs  Clear to auscultation bilateraly, without wheezes/crackles/rhonchi. Good air movement.  Skin  No rash/lesions/breakdown  Psychiatry  Alert and  oriented to person, place, and time  Abdomen  Distended, dull to percuss, fluid wave present. Nontender. No rebound or guarding. hernia. No palpable masses Back No CVA tenderness Genito Urinary: deferred Rectal  deferred Extremities  Very subtle 1+ edema on Rebecca right. Warm, well perfused.    Thereasa Solo, MD

## 2017-10-07 ENCOUNTER — Inpatient Hospital Stay (HOSPITAL_COMMUNITY): Payer: 59

## 2017-10-07 LAB — CBC WITH DIFFERENTIAL/PLATELET
BASOS PCT: 0 %
Basophils Absolute: 0 10*3/uL (ref 0.0–0.1)
EOS ABS: 0.1 10*3/uL (ref 0.0–0.7)
EOS PCT: 1 %
HEMATOCRIT: 28 % — AB (ref 36.0–46.0)
Hemoglobin: 8.8 g/dL — ABNORMAL LOW (ref 12.0–15.0)
LYMPHS PCT: 13 %
Lymphs Abs: 1.9 10*3/uL (ref 0.7–4.0)
MCH: 26.2 pg (ref 26.0–34.0)
MCHC: 31.4 g/dL (ref 30.0–36.0)
MCV: 83.3 fL (ref 78.0–100.0)
Monocytes Absolute: 1 10*3/uL (ref 0.1–1.0)
Monocytes Relative: 7 %
NEUTROS ABS: 11.5 10*3/uL — AB (ref 1.7–7.7)
Neutrophils Relative %: 79 %
Platelets: 270 10*3/uL (ref 150–400)
RBC: 3.36 MIL/uL — ABNORMAL LOW (ref 3.87–5.11)
RDW: 14.7 % (ref 11.5–15.5)
WBC: 14.5 10*3/uL — ABNORMAL HIGH (ref 4.0–10.5)

## 2017-10-07 LAB — BASIC METABOLIC PANEL
Anion gap: 11 (ref 5–15)
BUN: 36 mg/dL — AB (ref 6–20)
CALCIUM: 8.8 mg/dL — AB (ref 8.9–10.3)
CO2: 20 mmol/L — AB (ref 22–32)
CREATININE: 1.17 mg/dL — AB (ref 0.44–1.00)
Chloride: 109 mmol/L (ref 101–111)
GFR calc non Af Amer: 52 mL/min — ABNORMAL LOW (ref >60)
GLUCOSE: 127 mg/dL — AB (ref 65–99)
Potassium: 5.4 mmol/L — ABNORMAL HIGH (ref 3.5–5.1)
Sodium: 140 mmol/L (ref 135–145)

## 2017-10-07 LAB — MAGNESIUM: Magnesium: 2.3 mg/dL (ref 1.7–2.4)

## 2017-10-07 MED ORDER — IOPAMIDOL (ISOVUE-300) INJECTION 61%
INTRAVENOUS | Status: AC
  Filled 2017-10-07: qty 150

## 2017-10-07 MED ORDER — FUROSEMIDE 10 MG/ML IJ SOLN
60.0000 mg | Freq: Once | INTRAMUSCULAR | Status: AC
  Administered 2017-10-07: 60 mg via INTRAVENOUS
  Filled 2017-10-07: qty 6

## 2017-10-07 MED ORDER — FLEET ENEMA 7-19 GM/118ML RE ENEM
1.0000 | ENEMA | Freq: Every day | RECTAL | Status: DC | PRN
  Filled 2017-10-07: qty 1

## 2017-10-07 MED ORDER — POLYETHYLENE GLYCOL 3350 17 G PO PACK
17.0000 g | PACK | Freq: Every day | ORAL | Status: DC
  Administered 2017-10-07: 17 g via ORAL
  Filled 2017-10-07 (×2): qty 1

## 2017-10-07 MED ORDER — SENNOSIDES-DOCUSATE SODIUM 8.6-50 MG PO TABS
2.0000 | ORAL_TABLET | Freq: Two times a day (BID) | ORAL | Status: DC
  Filled 2017-10-07 (×3): qty 2

## 2017-10-07 MED ORDER — LACTULOSE 10 GM/15ML PO SOLN
30.0000 g | Freq: Three times a day (TID) | ORAL | Status: DC
  Filled 2017-10-07 (×3): qty 45

## 2017-10-07 MED ORDER — MORPHINE SULFATE (PF) 2 MG/ML IV SOLN
2.0000 mg | Freq: Once | INTRAVENOUS | Status: AC
  Administered 2017-10-07: 2 mg via INTRAMUSCULAR
  Filled 2017-10-07: qty 1

## 2017-10-07 MED ORDER — LACTULOSE 10 GM/15ML PO SOLN
20.0000 g | Freq: Three times a day (TID) | ORAL | Status: DC
  Administered 2017-10-07: 20 g via ORAL
  Filled 2017-10-07: qty 30

## 2017-10-07 MED ORDER — BISACODYL 10 MG RE SUPP: 10.0000 mg | Freq: Every day | RECTAL | Status: DC | PRN

## 2017-10-07 MED ORDER — MORPHINE SULFATE (PF) 2 MG/ML IV SOLN
2.0000 mg | INTRAVENOUS | Status: DC | PRN
Start: 2017-10-07 — End: 2017-10-08
  Administered 2017-10-08: 2 mg via INTRAVENOUS
  Administered 2017-10-08: 4 mg via INTRAVENOUS
  Administered 2017-10-08 (×3): 2 mg via INTRAVENOUS
  Filled 2017-10-07: qty 2
  Filled 2017-10-07 (×5): qty 1

## 2017-10-07 MED ORDER — HYDROMORPHONE HCL 1 MG/ML IJ SOLN
1.0000 mg | INTRAMUSCULAR | Status: DC | PRN
  Administered 2017-10-07 – 2017-10-08 (×8): 1 mg via INTRAVENOUS
  Filled 2017-10-07 (×8): qty 1

## 2017-10-07 NOTE — Progress Notes (Signed)
Patient ID: Rebecca [name], female   DOB: [birth year], 56 y.o.   MRN: [id number]  PROGRESS NOTE    Minie Roadcap  YYT:[id number] DOB: [birth year] DOA: 09/25/2017 PCP: Nicholos Johns, MD   Brief Narrative:  57 year old female with right adnexal mass and postmenopausal bleeding status post recent cervical biopsy under anesthesia on 09/25/2017 and subsequent pathology showing adenocarcinoma presented on 09/27/2017 with bloating/abdominal discomfort and constipation.  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   Assessment & Plan:   Active Problems:   SBO (small bowel obstruction) (HCC)   Fecal impaction (HCC)   Small bowel obstruction -Probably secondary to metastatic disease -Continue IV fluids and pain management.  Currently not nauseous or vomiting so will hold off on NG tube.   -General surgery has signed off  - GYN oncology following.   -No bowel movements yet.  Will add lactulose to the current regimen.  Continue MiraLAX, patient states that she cannot tolerate MiraLAX twice a day.  Continue senna.  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  Dr. Alvy Bimler will evaluate the patient on Monday -We will also order ultrasound-guided paracentesis and follow peritoneal fluid analysis -Status post ultrasound-guided left inguinal lymph node biopsy by IR on 10/04/2017.  Pathology pending -Patient has significant lower extremity edema.  Will also give 1 dose of IV Lasix.  Leukocytosis -Probably reactive.  Slightly getting worse.  Repeat a.m. Labs  Hyperkalemia -Slightly hyperkalemic again today.  Repeat a.m. Labs  Acute kidney injury -Probably secondary to poor oral intake and dehydration -Improving.  Continue IV fluid hydration.  Repeat a.m. 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 Denman George  Procedures:  ultrasound-guided left inguinal lymph node biopsy by IR on 10/04/2017   Antimicrobials: None   Subjective: Patient seen and examined at bedside.  She has not had any bowel movement yet.  Complains of intermittent abdominal pain.  No vomiting.  Objective: Vitals:   10/06/17 1359 10/06/17 2008 10/07/17 0545 10/07/17 1200  BP: 116/70 (!) 143/79 112/85   Pulse: (!) 114 (!) 115 (!) 111   Resp: 16 16 18    Temp: 98.7 F (37.1 C) 97.9 F (36.6 C) 97.8 F (36.6 C)   TempSrc: Oral Oral    SpO2: 95% (!) 56% 99%   Weight:    92.9 kg (204 lb 12.8 oz)  Height:    5\' 7"  (1.702 m)    Intake/Output Summary (Last 24 hours) at 10/07/2017 1232 Last data filed at 10/06/2017 1800 Gross per 24 hour  Intake 600 ml  Output -  Net 600 ml   Filed Weights   09/17/2017 0419 10/07/17 1200  Weight: 85.3 kg (188 lb) 92.9 kg (204 lb 12.8 oz)    Examination:  General exam: No acute distress. Respiratory system: Bilateral decreased breath sounds at bases Cardiovascular system: S1-S2 heard, rate controlled  gastrointestinal system: Abdomen is distended, soft with mild diffuse tenderness.  No bowel sounds heard  extremities: No cyanosis, clubbing; 2-3 + bilateral pitting edema  Data Reviewed: I have personally reviewed following labs and imaging studies  CBC: Recent Labs  Lab 09/15/2017 0703 10/04/2017 1628 10/04/17 0813 10/05/17 0452 10/06/17  0430 10/07/17 0431  WBC 15.8* 13.9* 13.1* 13.1* 12.2* 14.5*  NEUTROABS 12.8*  --  10.8* 10.6* 9.9* 11.5*  HGB 9.0* 8.9* 8.7* 8.9* 9.0* 8.8*  HCT 29.0* 28.4* 27.6* 28.3* 29.0* 28.0*  MCV 83.3 83.0 83.4 82.7 84.3 83.3  PLT 334 318 286 306 291 759   Basic Metabolic Panel: Recent Labs  Lab  09/28/2017 0830 09/22/2017 1628 10/04/17 0813 10/05/17 0452 10/06/17 0430 10/07/17 0431  NA 133*  --  135 139 138 140  K 5.6*  --  4.5 4.5 4.3 5.4*  CL 94*  --  98* 103 105 109  CO2 24  --  25 23 22  20*  GLUCOSE 114*  --  132* 128* 134* 127*  BUN 69*  --  65* 56* 42* 36*  CREATININE 2.66* 2.47* 2.09* 1.50* 1.20* 1.17*  CALCIUM 9.4  --  8.9 8.9 8.7* 8.8*  MG  --   --  2.3 2.3 2.1 2.3   GFR: Estimated Creatinine Clearance: 64.3 mL/min (A) (by C-G formula based on SCr of 1.17 mg/dL (H)). Liver Function Tests: Recent Labs  Lab 09/18/2017 0830 10/04/17 0813 10/05/17 0452  AST 37 23 27  ALT 28 22 24   ALKPHOS 53 48 48  BILITOT 1.0 0.6 0.4  PROT 7.8 6.9 7.2  ALBUMIN 2.7* 2.4* 2.4*   No results for input(s): LIPASE, AMYLASE in the last 168 hours. No results for input(s): AMMONIA in the last 168 hours. Coagulation Profile: Recent Labs  Lab 09/24/2017 0703  INR 1.10   Cardiac Enzymes: No results for input(s): CKTOTAL, CKMB, CKMBINDEX, TROPONINI in the last 168 hours. BNP (last 3 results) No results for input(s): PROBNP in the last 8760 hours. HbA1C: No results for input(s): HGBA1C in the last 72 hours. CBG: No results for input(s): GLUCAP in the last 168 hours. Lipid Profile: No results for input(s): CHOL, HDL, LDLCALC, TRIG, CHOLHDL, LDLDIRECT in the last 72 hours. Thyroid Function Tests: No results for input(s): TSH, T4TOTAL, FREET4, T3FREE, THYROIDAB in the last 72 hours. Anemia Panel: No results for input(s): VITAMINB12, FOLATE, FERRITIN, TIBC, IRON, RETICCTPCT in the last 72 hours. Sepsis Labs: No results for input(s): PROCALCITON, LATICACIDVEN in the last 168 hours.  No results found for this or any previous visit (from the past 240 hour(s)).       Radiology Studies: No results found.      Scheduled Meds: . heparin  5,000 Units Subcutaneous Q8H  . iopamidol      . lactulose  20 g Oral TID  . polyethylene glycol  17 g Oral Daily  . senna-docusate  1 tablet  Oral BID   Continuous Infusions: . dextrose 5 % and 0.9% NaCl 50 mL/hr at 10/07/17 1155     LOS: 2 days        Aline August, MD [facility] Pager [phone]  If 7PM-7AM, please contact night-coverage [email or web address] Password [facility] 10/07/2017, 12:32 PM

## 2017-10-07 NOTE — Consult Note (Signed)
Consult Note: Gyn-Onc  Consult was requested by Dr. Starla Link for the evaluation of Rebecca [name] 57 y.o. female  CC:     Chief Complaint  Patient presents with  Stage IV endocervical cancer Severe constipation vs possible partial SBO.  Assessment/Plan:  Rebecca [name]  is a 57 y.o.  year old with clinical stage IV endocervical adenocarcinoma and inability to pass bowel movements.  1/ constipation vs bowel obstruction: she has a non-surgical abdomen and imaging does not suggest an obstruction necessitating surgical intervention (no grossly dilated bowel).  She is passing minimal flatus with no emesis/nausea or belching.  Oral agents and suppositories not successful. But not on anything at present. Will continue with miralax in am and sennakot S BID. Agree with lactulose. Recommended prune juice.  Delayed transit is likely secondary to opiod analgesia. Obstruction (large bowel) from cervical cancer less likely but a possibility. If this declares itself (progressive distension on radiographic images and colicky abdominal pains) surgical intervention with palliative colostomy could be considered. Could consider paracentesis to help with abdominal distension.  Have ordered CT abd/pelvis with po contrast and Iv contrast tomorrow for both the cathartic effect of contrast and to evaluate for mild elevation in WBC and whether there is sign of large bowel obstruction.   2/ stage IV endocervical cancer. I discussed with the patient her diagnosis, her pathology results and her imaging. I discussed that the treatment for this is chemotherapy. Given that she has minimal bleeding, I believe radiation has a less important role, particularly as she has extensive abdominal carcinomatosis which would be untreated by radiation. Biopsy of groin nodes pending. PET scheduled for 10/11/17 to determine extent of disease. Dr Alvy Bimler will return on 10/08/17 and I will have her see the patient to discuss  chemotherapy at that time. We would not entertain chemotherapy until her GI function had improved to the point that surgery was determined to be not an option or not necessary.   HPI: Rebecca [name] is a very pleasant 57 year old P0 who was seen in consultation at the request of Dr. Ernestina Patches for postmenopausal bleeding, discharge, pelvic pain, elevated Ca1 25, and abnormal CT scan.  The patient reports a two-month history of vaginal bleeding that began in February 2019.  It was not heavy bleeding and requiring a panty liner. She was then evaluated with a transvaginal ultrasound on August 29, 2017.  This revealed an anteverted uterus measuring 11 x 7.2 x 7.6 cm with 2 3-4 cm fibroids.  The endometrium was seen with fluid and unable to visualize well during to the multiple myomas.  Bilateral ovaries were not seen.  However in the right adnexa solid cystic mass measuring 4.7 cm which was avascular was seen is unclear if this was an ovary or uterine myoma.  There is no free fluid in the cul-de-sac.  She then developed rapid onset unilateral right lower extremity edema that was significant and she was seen by an urgent care for evaluation and then there was a long ED.  A CT scan of the abdomen and pelvis on 09/15/17  were performed to evaluate for potential cause for right leg pain and swelling.  This revealed no evidence of venous occlusive disease.  However there were prominent lymph nodes in the groin regions bilaterally measuring up to 1.3 cm.  Within the abdomen there is a small amount of fluid around the liver.  There is possible nodular infiltration in the mesentery and omentum.  The uterus was prominent and measured 7.8  x 10.3 cm with multiple fibroids.  The endometrium was difficult to delineate.  The ovaries were not clearly identified separate from the enlarged uterus.    Ca1 25 was drawn on August 30, 2017 and was elevated at 97.3.  CEA was normal.  The patient reports that her last Pap smear was  approximately 5 years ago and was normal.  She denies ever having a prior abnormal Pap smear.  She has had 2 first trimester miscarriages but no full-term pregnancies.  She is otherwise healthy with respect to medical comorbidities with the exception of hypertension.  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  On 09/25/17 she underwent exam under anesthesia and cervical biopsies. A very locally advanced cervical cancer was appreciated extending along the right uterosacral ligament to the sidewall.  Pathology revealed a poorly differentiated adenocarcinoma. Immunohistochemical stains show that the tumor cells are positive for p16 and vimentin; while they are negative for estrogen receptor and CEA. This immunohistochemical profile may favor an endocervical primary but is not diagnostic.  On 09/17/2017 she was admitted to [facility] with inability to pass a bowel movement. She felt full and distended, and was passing gas but could not have a BM even with aggressive laxative use at home. Golytely 1 gallon did not yield results.  CT abd/pelvis on 09/29/2017 showed large stool burden in the colon, particularly the right colon and transverse colon. Mild distention of small bowel loops in the upper abdomen and decompressed small bowel loops distally. Findings could reflect low grade small bowel obstruction. Borderline inguinal lymph nodes bilaterally. Index left inguinal lymph node has a short axis diameter of 11 mm. Similarly sized bilateral inguinal lymph nodes. Previously seen heterogeneous enhancement and multiple focal masses throughout the uterus is not as well visualized on this unenhanced study. No definite visible adnexal mass. Moderate free fluid in the abdomen and pelvis, increasing since prior study. There is stranding and thickening of the omentum in the left anterior abdomen concerning for omental/peritoneal disease/caking.  On 10/04/17 she underwent ultrasound guided inguinal node  biopsy.  Interval Hx:   Despite soap suds enema on 10/01/2017, no relief of constipation. Tolerating PO but feels "full". She denies abdominal pain. She continues to have back and leg pain. She denies significant bleeding.  Miralax and Senakot not working. Will try laculose today.  She has had no emesis, but has had some mild nausea this morning.  Abdominal films from this morning not yet read - I evaluated the images and see stool in right and transverse colon but do not see appreciable dilation of cecum. There is some air fluid levels seen, but no gross distension.   CBC    Component Value Date/Time   WBC 14.5 (H) 10/07/2017 0431   RBC 3.36 (L) 10/07/2017 0431   HGB 8.8 (L) 10/07/2017 0431   HGB 11.2 09/12/2017 1435   HCT 28.0 (L) 10/07/2017 0431   HCT 35.1 09/12/2017 1435   PLT 270 10/07/2017 0431   PLT 413 (H) 09/12/2017 1435   MCV 83.3 10/07/2017 0431   MCV 87 09/12/2017 1435   MCH 26.2 10/07/2017 0431   MCHC 31.4 10/07/2017 0431   RDW 14.7 10/07/2017 0431   RDW 13.5 09/12/2017 1435   LYMPHSABS PENDING 10/07/2017 0431   MONOABS PENDING 10/07/2017 0431   EOSABS PENDING 10/07/2017 0431   BASOSABS PENDING 10/07/2017 0431   BMET    Component Value Date/Time   NA 140 10/07/2017 0431   K 5.4 (  H) 10/07/2017 0431   CL 109 10/07/2017 0431   CO2 20 (L) 10/07/2017 0431   GLUCOSE 127 (H) 10/07/2017 0431   BUN 36 (H) 10/07/2017 0431   CREATININE 1.17 (H) 10/07/2017 0431   CALCIUM 8.8 (L) 10/07/2017 0431   GFRNONAA 52 (L) 10/07/2017 0431   GFRAA >60 10/07/2017 0431     Current Meds:      Outpatient Encounter Medications as of 09/19/2017  Medication Sig  . lisinopril-hydrochlorothiazide (PRINZIDE,ZESTORETIC) 20-12.5 MG tablet Take 1 tablet by mouth daily.  . Multiple Vitamin (MULTIVITAMIN) tablet Take 1 tablet by mouth daily.  Marland Kitchen oxyCODONE (OXY IR/ROXICODONE) 5 MG immediate release tablet Take 1 tablet (5 mg total) by mouth every 4 (four) hours as needed for up to 7 days for  severe pain.  . [DISCONTINUED] lisinopril (PRINIVIL,ZESTRIL) 20 MG tablet Take by mouth daily.   No facility-administered encounter medications on file as of 09/19/2017.     Allergy: No Known Allergies  Social Hx:   Social History        Socioeconomic History  . Marital status: Single    Spouse name: Not on file  . Number of children: Not on file  . Years of education: Not on file  . Highest education level: Not on file  Occupational History  . Not on file  Social Needs  . Financial resource strain: Not on file  . Food insecurity:    Worry: Not on file    Inability: Not on file  . Transportation needs:    Medical: Not on file    Non-medical: Not on file  Tobacco Use  . Smoking status: Never Smoker  . Smokeless tobacco: Never Used  Substance and Sexual Activity  . Alcohol use: Never    Frequency: Never  . Drug use: Never  . Sexual activity: Not Currently    Birth control/protection: Post-menopausal  Lifestyle  . Physical activity:    Days per week: Not on file    Minutes per session: Not on file  . Stress: Not on file  Relationships  . Social connections:    Talks on phone: Not on file    Gets together: Not on file    Attends religious service: Not on file    Active member of club or organization: Not on file    Attends meetings of clubs or organizations: Not on file    Relationship status: Not on file  . Intimate partner violence:    Fear of current or ex partner: Not on file    Emotionally abused: Not on file    Physically abused: Not on file    Forced sexual activity: Not on file  Other Topics Concern  . Not on file  Social History Narrative  . Not on file    Past Surgical Hx:       Past Surgical History:  Procedure Laterality Date  . DILATION AND CURETTAGE OF UTERUS  2000   SAB    Past Medical Hx:      Past Medical History:  Diagnosis Date  . Fibroids   . Hypertension     Past  Gynecological History:  [id number] No LMP recorded. Patient is postmenopausal.  Family Hx:       Family History  Problem Relation Age of Onset  . Hypertension Mother   . Breast cancer Maternal Aunt 50    Review of Systems:  Constitutional  Feels well,    ENT Normal appearing ears and nares bilaterally Skin/Breast  No rash,  sores, jaundice, itching, dryness Cardiovascular  No chest pain, shortness of breath, or edema  Pulmonary  No cough or wheeze.  Gastro Intestinal  1. No nausea, vomitting, + constipation. Denies abdominal pain Genito Urinary  No frequency, urgency, dysuria, + bleeding and discharge + pelvic pain Musculo Skeletal  + right lower extremity edema - fluctuates Neurologic  No weakness, numbness, change in gait,  Psychology  No depression, anxiety, insomnia.   Vitals:  Blood pressure (!) 146/75, pulse (!) 118, temperature 98.3 F (36.8 C), temperature source Oral, resp. rate 20, height 5\' 7"  (1.702 m), weight 185 lb 4.8 oz (84.1 kg), SpO2 100 %.  Physical Exam: WD in NAD appears comfortable Neck  Supple NROM, without any enlargements.  Lymph Node Survey + bilateral palpable inguinal lymphadenopathy Cardiovascular  Pulse normal rate, regularity and rhythm. S1 and S2 normal.  Lungs  Clear to auscultation bilateraly, without wheezes/crackles/rhonchi. Good air movement.  Skin  No rash/lesions/breakdown  Psychiatry  Alert and oriented to person, place, and time  Abdomen  Distended, particularly in the epigastric abdomen, dull to percuss, fluid wave present. Nontender. No rebound or guarding. hernia. No palpable masses Back No CVA tenderness Genito Urinary: deferred Rectal  deferred Extremities  Very subtle 1+ edema on the right. Warm, well perfused.    Thereasa Solo, MD

## 2017-10-08 ENCOUNTER — Telehealth: Payer: Self-pay | Admitting: Oncology

## 2017-10-08 ENCOUNTER — Inpatient Hospital Stay: Payer: Self-pay

## 2017-10-08 ENCOUNTER — Inpatient Hospital Stay (HOSPITAL_COMMUNITY): Payer: 59

## 2017-10-08 DIAGNOSIS — R180 Malignant ascites: Secondary | ICD-10-CM

## 2017-10-08 LAB — CBC WITH DIFFERENTIAL/PLATELET
BASOS ABS: 0 10*3/uL (ref 0.0–0.1)
BASOS PCT: 0 %
EOS ABS: 0 10*3/uL (ref 0.0–0.7)
EOS PCT: 0 %
HCT: 30.5 % — ABNORMAL LOW (ref 36.0–46.0)
Hemoglobin: 9.3 g/dL — ABNORMAL LOW (ref 12.0–15.0)
LYMPHS PCT: 7 %
Lymphs Abs: 1.2 10*3/uL (ref 0.7–4.0)
MCH: 25.9 pg — ABNORMAL LOW (ref 26.0–34.0)
MCHC: 30.5 g/dL (ref 30.0–36.0)
MCV: 85 fL (ref 78.0–100.0)
MONO ABS: 0.9 10*3/uL (ref 0.1–1.0)
Monocytes Relative: 5 %
Neutro Abs: 15.1 10*3/uL — ABNORMAL HIGH (ref 1.7–7.7)
Neutrophils Relative %: 88 %
PLATELETS: 270 10*3/uL (ref 150–400)
RBC: 3.59 MIL/uL — AB (ref 3.87–5.11)
RDW: 15 % (ref 11.5–15.5)
WBC: 17.2 10*3/uL — AB (ref 4.0–10.5)

## 2017-10-08 LAB — COMPREHENSIVE METABOLIC PANEL
ALBUMIN: 2.5 g/dL — AB (ref 3.5–5.0)
ALT: 25 U/L (ref 14–54)
AST: 25 U/L (ref 15–41)
Alkaline Phosphatase: 57 U/L (ref 38–126)
Anion gap: 12 (ref 5–15)
BUN: 40 mg/dL — AB (ref 6–20)
CHLORIDE: 107 mmol/L (ref 101–111)
CO2: 20 mmol/L — AB (ref 22–32)
CREATININE: 2.09 mg/dL — AB (ref 0.44–1.00)
Calcium: 9.3 mg/dL (ref 8.9–10.3)
GFR calc Af Amer: 30 mL/min — ABNORMAL LOW (ref >60)
GFR, EST NON AFRICAN AMERICAN: 26 mL/min — AB (ref >60)
Glucose, Bld: 139 mg/dL — ABNORMAL HIGH (ref 65–99)
Potassium: 5.3 mmol/L — ABNORMAL HIGH (ref 3.5–5.1)
SODIUM: 139 mmol/L (ref 135–145)
Total Bilirubin: 0.7 mg/dL (ref 0.3–1.2)
Total Protein: 8 g/dL (ref 6.5–8.1)

## 2017-10-08 LAB — MAGNESIUM: Magnesium: 2.2 mg/dL (ref 1.7–2.4)

## 2017-10-08 MED ORDER — ONDANSETRON HCL 4 MG/2ML IJ SOLN
4.0000 mg | Freq: Four times a day (QID) | INTRAMUSCULAR | Status: DC | PRN
  Administered 2017-10-08: 4 mg via INTRAVENOUS
  Filled 2017-10-08: qty 2

## 2017-10-08 MED ORDER — DIPHENHYDRAMINE HCL 50 MG/ML IJ SOLN: 12.5000 mg | Freq: Four times a day (QID) | INTRAMUSCULAR | Status: DC | PRN

## 2017-10-08 MED ORDER — SODIUM CHLORIDE 0.9% FLUSH: 10.0000 mL | INTRAVENOUS | Status: DC | PRN

## 2017-10-08 MED ORDER — NALOXONE HCL 0.4 MG/ML IJ SOLN: 0.4000 mg | INTRAMUSCULAR | Status: DC | PRN

## 2017-10-08 MED ORDER — IOPAMIDOL (ISOVUE-300) INJECTION 61%: 15.0000 mL | Freq: Once | INTRAVENOUS | Status: AC | PRN

## 2017-10-08 MED ORDER — SODIUM CHLORIDE 0.9% FLUSH
10.0000 mL | Freq: Two times a day (BID) | INTRAVENOUS | Status: DC
  Administered 2017-10-08: 10 mL

## 2017-10-08 MED ORDER — HYDROMORPHONE 1 MG/ML IV SOLN
INTRAVENOUS | Status: DC
  Administered 2017-10-08: 23:00:00 via INTRAVENOUS
  Administered 2017-10-09: 1.2 mg via INTRAVENOUS
  Filled 2017-10-08: qty 25

## 2017-10-08 MED ORDER — LACTULOSE 10 GM/15ML PO SOLN
30.0000 g | Freq: Four times a day (QID) | ORAL | Status: DC
  Filled 2017-10-08: qty 45

## 2017-10-08 MED ORDER — SODIUM CHLORIDE 0.9 % IV SOLN: 4.0000 mg | Freq: Four times a day (QID) | INTRAVENOUS | Status: DC | PRN

## 2017-10-08 MED ORDER — ONDANSETRON HCL 4 MG/2ML IJ SOLN: 4.0000 mg | Freq: Four times a day (QID) | INTRAMUSCULAR | Status: DC | PRN

## 2017-10-08 MED ORDER — SODIUM CHLORIDE 0.9% FLUSH: 9.0000 mL | INTRAVENOUS | Status: DC | PRN

## 2017-10-08 MED ORDER — IOPAMIDOL (ISOVUE-300) INJECTION 61%
INTRAVENOUS | Status: AC
  Filled 2017-10-08: qty 30

## 2017-10-08 MED ORDER — DIPHENHYDRAMINE HCL 12.5 MG/5ML PO ELIX: 12.5000 mg | ORAL_SOLUTION | Freq: Four times a day (QID) | ORAL | Status: DC | PRN

## 2017-10-08 NOTE — Progress Notes (Signed)
Pt declines taking scheduled meds at this writer's request. Medications have given for the past two night accordinging to MD   Orders. Pt reports that laxatives "don't seem to be helping."

## 2017-10-08 NOTE — Telephone Encounter (Signed)
Talked to Dr. Starla Link about port not being able to be placed until Thursday.  He will put in an order for a PICC line.

## 2017-10-08 NOTE — Progress Notes (Signed)
Patient to be transferred to room 1337, report given to [facility]

## 2017-10-08 NOTE — Progress Notes (Signed)
IV team at bedside to place midline catheter for patient to begin chemo, refused at this time.  Pain medication administered, IV nurse will attempt later

## 2017-10-08 NOTE — Progress Notes (Signed)
   Subjective: Patient reports healing "queasy" and abdominal pain.  Her pain was enough that she was unable to lie flat during her ordered CT scan.  She states to me she would like to try a paracentesis prior to another attempt at CT imaging.  Per nursing paracentesis has been ordered + flatus.    Objective: Vital signs in last 24 hours: Temp:  [98.1 F (36.7 C)-98.7 F (37.1 C)] 98.3 F (36.8 C) (04/29 1342) Pulse Rate:  [110-121] 121 (04/29 1342) Resp:  [16-20] 16 (04/29 1342) BP: (99-121)/(72-78) 99/72 (04/29 1342) SpO2:  [94 %-95 %] 95 % (04/29 1342) Weight:  [204 lb 13.3 oz (92.9 kg)] 204 lb 13.3 oz (92.9 kg) (04/29 0419) Last BM Date: 09/18/17  Intake/Output from previous day: 04/28 0701 - 04/29 0700 In: 150 [P.O.:150] Out: 500 [Urine:500]  Physical Examination: General: mild distress Resp: clear to auscultation bilaterally Cardio: Tachycardic GI: Soft mildly tender normal active bowel sounds Extremities: edema 2+ bilateral  Labs: WBC/Hgb/Hct/Plts:  17.2/9.3/30.5/270 (04/29 0416) BUN/Cr/glu/ALT/AST/amyl/lip:  40/2.09/--/25/25/--/-- (04/29 0416)   Assessment:  57 y.o. metastatic cervical cancer  Plan: 1. Reasonable to attempt paracentesis to improve discomfort for CTScan, however I have discussed with nursing trying to take her down again for CT later in the early evening. I do not think she needs additional oral contrast. 2. Nausea - Zofran PRN ordered. 3. If paracentesis not performed due to patient discomfort then I doubt she will tolerate another attempt at CT and will plan xray imaging to followup bowel findings.  Isabel Caprice 10/08/2017, 5:13 PM

## 2017-10-08 NOTE — Progress Notes (Signed)
Patient unable to tolerate laying flat for CT, when asked about Paracentesis stated that she did not feel like she could handle it right now, if she felt better she would let us know if she would have it done.  MD aware

## 2017-10-08 NOTE — Progress Notes (Signed)
Pt has remained up in chair at bedside. States abd discomfort is not improving . Pt relaxing at the moment in chair at bedside. Noted to have bilateral lower extremity dependent pedal edema.

## 2017-10-08 NOTE — Progress Notes (Signed)
Patient ID: Rebecca [name], female   DOB: 06/09/1961, 57 y.o.   MRN: [id number]  PROGRESS NOTE    Nada Godley  FGH:[id number] DOB: Oct 17, 1960 DOA: 09/14/2017 PCP: Nicholos Johns, MD   Brief Narrative:  57 year old female with right adnexal mass and postmenopausal bleeding status post recent cervical biopsy under anesthesia on 09/25/2017 and subsequent pathology showing adenocarcinoma presented on 09/12/2017 with bloating/abdominal discomfort and constipation.  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   Assessment & Plan:   Active Problems:   SBO (small bowel obstruction) (HCC)   Fecal impaction (HCC)   Small bowel obstruction -Probably secondary to metastatic disease -Continue IV fluids and pain management.  Currently not nauseous or vomiting so will hold off on NG tube.   -General surgery has signed off  - GYN oncology following.   -No bowel movements yet.  Increase lactulose to 4 times a day.  Continue MiraLAX, patient states that she cannot tolerate MiraLAX twice a day.  Continue senna.  Patient refused Dulcolax suppository and enema as she stated that these do not work.  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  Spoke to Dr. Alvy Bimler who will see the patient in consultation.  She requested port placement by IR and transfer the patient to 3 W. for initiation of chemotherapy. -Paracentesis pending.  Follow peritoneal fluid analysis -Status post ultrasound-guided left inguinal lymph node biopsy by IR on 10/04/2017.  Pathology pending -Patient has significant lower extremity edema.  We will hold off on any more Lasix because of slightly worsened renal function -Repeat CAT scan has been ordered by GYN oncology.  Leukocytosis -Probably reactive.  Worsening.  Repeat a.m. Labs  Hyperkalemia -Slightly hyperkalemic again  today.  Repeat a.m. Labs  Acute kidney injury -Probably secondary to poor oral intake and dehydration -Creatinine worse today.  Increase normal saline to 100 cc an hour.  Repeat a.m. 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 Denman George  Procedures:  ultrasound-guided left inguinal lymph node biopsy by IR on 10/04/2017   Antimicrobials: None   Subjective: Patient seen and examined at bedside.  She has not had any bowel movements with.  She sometimes passes some gas.  Her abdominal pain is better controlled.  She slept better  last night.  No overnight vomiting.  Objective: Vitals:   10/07/17 1200 10/07/17 1310 10/07/17 2205 10/08/17 0419  BP:  129/75 121/78 111/78  Pulse:  (!) 119 (!) 110 (!) 118  Resp:  19 20 18   Temp:  98.1 F (36.7 C) 98.7 F (37.1 C) 98.1 F (36.7 C)  TempSrc:  Oral Oral Oral  SpO2:  99% 94% 95%  Weight: 92.9 kg (204 lb 12.8 oz)   92.9 kg (204 lb 13.3 oz)  Height: 5\' 7"  (1.702 m)       Intake/Output Summary (Last 24 hours) at 10/08/2017 0950 Last data filed at 10/08/2017 0500 Gross per 24 hour  Intake 150 ml  Output 500 ml  Net -350 ml   Filed Weights   09/11/2017 0419 10/07/17 1200 10/08/17 0419  Weight: 85.3 kg (188 lb) 92.9 kg (204 lb 12.8 oz) 92.9 kg (204 lb 13.3 oz)    Examination:  General exam: No acute distress.  Calm and comfortable Respiratory system: Bilateral decreased breath sounds at bases Cardiovascular system: S1-S2 heard; tachycardic  gastrointestinal system: Abdomen is distended, soft with mild diffuse tenderness.  No bowel sounds heard  extremities: No cyanosis, clubbing; 2 + bilateral pitting edema  Data Reviewed: I have personally reviewed following labs and imaging studies  CBC: Recent Labs  Lab  10/04/17 0813 10/05/17 0452 10/06/17 0430 10/07/17 0431 10/08/17 0416  WBC 13.1* 13.1* 12.2* 14.5* 17.2*  NEUTROABS 10.8* 10.6* 9.9* 11.5* 15.1*  HGB 8.7* 8.9* 9.0* 8.8* 9.3*  HCT 27.6* 28.3* 29.0* 28.0* 30.5*  MCV 83.4 82.7 84.3 83.3 85.0  PLT 286 306 291 270 914   Basic Metabolic Panel: Recent Labs  Lab 10/04/17 0813 10/05/17 0452 10/06/17 0430 10/07/17 0431 10/08/17 0416  NA 135 139 138 140 139  K 4.5 4.5 4.3 5.4* 5.3*  CL 98* 103 105 109 107  CO2 25 23 22  20* 20*  GLUCOSE 132* 128* 134* 127* 139*  BUN 65* 56* 42* 36* 40*  CREATININE 2.09* 1.50* 1.20* 1.17* 2.09*  CALCIUM 8.9 8.9 8.7* 8.8* 9.3  MG 2.3 2.3 2.1 2.3 2.2   GFR: Estimated Creatinine Clearance: 36 mL/min (A) (by C-G formula based on SCr of 2.09 mg/dL (H)). Liver Function Tests: Recent Labs  Lab 09/29/2017 0830 10/04/17 0813 10/05/17 0452 10/08/17 0416  AST 37 23 27 25   ALT 28 22 24 25   ALKPHOS 53 48 48 57  BILITOT 1.0 0.6 0.4 0.7  PROT 7.8 6.9 7.2 8.0  ALBUMIN 2.7* 2.4* 2.4* 2.5*   No results for input(s): LIPASE, AMYLASE in the last 168 hours. No results for input(s): AMMONIA in the last 168 hours. Coagulation Profile: Recent Labs  Lab 09/29/2017 0703  INR 1.10   Cardiac Enzymes: No results for input(s): CKTOTAL, CKMB, CKMBINDEX, TROPONINI in the last 168 hours. BNP (last 3 results) No results for input(s): PROBNP in the last 8760 hours. HbA1C: No results for input(s): HGBA1C in the last 72 hours. CBG: No results for input(s): GLUCAP in the last 168 hours. Lipid Profile: No results for input(s): CHOL, HDL, LDLCALC, TRIG, CHOLHDL, LDLDIRECT in the last 72 hours. Thyroid Function Tests: No results for input(s): TSH, T4TOTAL, FREET4, T3FREE, THYROIDAB in the last 72 hours. Anemia Panel: No results for input(s): VITAMINB12, FOLATE, FERRITIN, TIBC, IRON, RETICCTPCT in the last 72 hours. Sepsis Labs: No results for input(s): PROCALCITON, LATICACIDVEN in the last 168 hours.  No results found  for this or any previous visit (from the past 240 hour(s)).       Radiology Studies: Dg Abd 2 Views  Result Date: 10/07/2017 CLINICAL DATA:  Bowel obstruction. EXAM: ABDOMEN - 2 VIEW COMPARISON:  10/05/2017 FINDINGS: Air, contrast and stool is present within the colon. There are persistent dilated air-filled small bowel loops in the central abdomen measuring up to 4.1 cm in diameter with air-fluid levels. Remainder of the exam is unchanged. IMPRESSION: Persistent air-filled dilated central small bowel loops with air-fluid levels compatible with persistent partial small bowel obstruction. Electronically Signed   By: Marin Olp M.D.   On: 10/07/2017 13:48        Scheduled Meds: . heparin  5,000 Units Subcutaneous Q8H  . iopamidol      . lactulose  30 g Oral TID  . polyethylene glycol  17 g Oral Daily  . senna-docusate  2 tablet Oral BID   Continuous Infusions: . dextrose 5 % and 0.9% NaCl 50 mL/hr at 10/07/17 1155     LOS:  3 days        Aline August, MD [facility] Pager [phone]  If 7PM-7AM, please contact night-coverage [email or web address] Password [email or web address] 10/08/2017, 9:50 AM

## 2017-10-09 ENCOUNTER — Telehealth: Payer: Self-pay | Admitting: Gynecologic Oncology

## 2017-10-09 MED FILL — Medication: Qty: 1 | Status: AC

## 2017-10-10 ENCOUNTER — Ambulatory Visit
Admit: 2017-10-10 | Discharge: 2017-10-10 | Disposition: A | Discharge status: Home / Self Care | Payer: 59 | Attending: Radiation Oncology | Admitting: Radiation Oncology

## 2017-10-10 ENCOUNTER — Ambulatory Visit: Payer: 59

## 2017-10-10 ENCOUNTER — Ambulatory Visit: Payer: 59 | Admitting: Hematology and Oncology

## 2017-10-10 NOTE — Telephone Encounter (Signed)
Attempted to call patient's mother at the listed number to discuss events surround her death. No answer to her call. No other numbers listed.  Will continue to attempt to make contact.  Thereasa Solo, MD

## 2017-10-10 NOTE — Progress Notes (Signed)
Called medical examiner Marquita Palms at [phone] to speak with him on a possible case for this pt. Mr.Neal stated that he believes pt does not qualify for ME case.

## 2017-10-10 NOTE — Death Summary Note (Signed)
Death Summary  Rebecca [name] QAS:[id number] DOB: Mar 15, 1961 DOA: 24-Oct-2017  PCP: Nicholos Johns, MD  Admit date: 24-Oct-2017 Date of Death: 2017-10-30 Time of Death: 34   History of present illness:  Rebecca [name] was a 57 y.o. female with a history of  right adnexal mass and postmenopausal bleeding status post recent cervical biopsy under anesthesia on 09/25/2017 and subsequent pathology showing adenocarcinoma presented on 10/24/2017 with bloating/abdominal discomfort and constipation.  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  General surgery and GYN oncology were consulted.  General surgery signed off.  Patient's condition did not improve.  She did not have any bowel movement during hospitalization despite he was of multiple laxatives, patient also refused to have some laxative like Dulcolax suppository and enema because he stated that these had not worked.  Because of worsening abdominal pain and abdominal distention, she was supposed to have a repeat CAT scan of the abdomen and also paracentesis but patient refused the CAT scan because she could not lay down flat.  Oncology was also involved and patient was supposed to be started on chemotherapy soon as per oncology/Dr. Alvy Bimler.  Patient suffered a cardiac arrest early in the morning of Oct 30, 2017 and despite CPR and ACLS treatment, patient did not recover.  She expired on 2017-10-30 at 0424.   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    Significant Diagnostic Studies: Ct Abdomen Pelvis Wo Contrast  Result Date:  2017/10/24 CLINICAL DATA:  Abdominal distention, pelvic pain.  Cervical cancer. EXAM: CT ABDOMEN AND PELVIS WITHOUT CONTRAST TECHNIQUE: Multidetector CT imaging of the abdomen and pelvis was performed following the standard protocol without IV contrast. COMPARISON:  09/15/2017 FINDINGS: Lower chest: Increasing bibasilar airspace opacities, left greater than right, likely atelectasis although pneumonia cannot be excluded, particularly at the left base. No effusions. Heart is normal size. Hepatobiliary: No focal hepatic abnormality. Layering slightly high-density material noted in the gallbladder could reflect sludge or stones. Pancreas: No focal abnormality or ductal dilatation. Spleen: No focal abnormality.  Normal size. Adrenals/Urinary Tract: Small hyperdense area within the midpole of the left kidney, likely small hemorrhagic cyst. No hydronephrosis. Punctate calcification in the lower pole of the right kidney. Adrenal glands unremarkable. Urinary bladder decompressed, grossly unremarkable. Stomach/Bowel: Large stool burden in the colon, particularly the right colon and transverse colon. Mild distention of small bowel loops in the upper abdomen and decompressed small bowel loops distally. Findings could reflect low grade small bowel obstruction. Vascular/Lymphatic: Scattered aortic calcifications. No aneurysm or adenopathy. Borderline inguinal lymph nodes bilaterally. Index left inguinal lymph node has a short axis diameter of 11 mm on image 84. Similarly sized bilateral inguinal lymph nodes. Reproductive: Previously seen heterogeneous enhancement and multiple focal masses throughout the uterus is not as well visualized on this unenhanced study. No definite visible adnexal mass. Other: Moderate free fluid in the abdomen and pelvis, increasing since prior study. There is stranding and thickening of the omentum in the left anterior abdomen concerning for omental/peritoneal disease/caking. Musculoskeletal: Sclerosis  within the right iliac bone adjacent to the SI joint may be related to sacroiliitis. This is stable. No acute bony abnormality. IMPRESSION: The complex uterine/pelvic process seen on prior contrast  enhanced study is not as well visualized on today's unenhanced study. There is increasing ascites in the abdomen and pelvis, and increasing omental thickening/stranding concerning for mental metastatic disease/caking. Large amount of stool in the right colon and transverse colon. Prominent upper abdominal small bowel loops. Distal small bowel loops are decompressed. Findings could reflect low grade partial small bowel obstruction. Exact cause or transition not visible. Bibasilar atelectasis or infiltrates, left greater than right. Electronically Signed   By: Rolm Baptise M.D.   On: 10/06/2017 13:49   Dg Chest 2 View  Result Date: 09/13/2017 CLINICAL DATA:  Shortness of breath EXAM: CHEST - 2 VIEW COMPARISON:  Chest radiograph and chest CT September 15, 2017 FINDINGS: Degree of inspiration is shallow. There is atelectatic change in both lung bases. There is no frank edema or consolidation. Heart size and pulmonary vascularity are normal. No adenopathy. No evident bone lesions. IMPRESSION: Shallow degree of inspiration with bibasilar atelectasis. No frank edema or consolidation. Stable cardiac silhouette. Electronically Signed   By: Lowella Grip III M.D.   On: 09/21/2017 10:39   Dg Chest 2 View  Result Date: 09/15/2017 CLINICAL DATA:  Shortness of breath EXAM: CHEST - 2 VIEW COMPARISON:  None. FINDINGS: Low volume film. Streaky opacity in the posterior right lung base compatible with atelectasis or possible pneumonia. Left lung clear. The cardiopericardial silhouette is within normal limits for size. The visualized bony structures of the thorax are intact. Telemetry leads overlie the chest. IMPRESSION: Posterior right basilar opacity compatible with atelectasis or pneumonia. Electronically Signed   By: Misty Stanley  M.D.   On: 09/15/2017 20:07   Dg Abd 1 View  Result Date: 10/04/2017 CLINICAL DATA:  Abdominal pain, distention EXAM: ABDOMEN - 1 VIEW COMPARISON:  CT 09/13/2017 FINDINGS: Large stool burden throughout the colon. Mild gaseous distention of mid abdominal small bowel loops. No free air organomegaly. No acute bony abnormality. IMPRESSION: Continued large stool burden in the colon and mildly prominent mid abdominal small bowel loops. This could reflect ileus or small-bowel obstruction. Electronically Signed   By: Rolm Baptise M.D.   On: 10/04/2017 09:40   Ct Angio Chest Pe W/cm &/or Wo Cm  Result Date: 09/15/2017 CLINICAL DATA:  Intermittent mid upper abdominal pain for 1 week. Shortness of breath 2 days ago. Right leg pain and left leg swelling yesterday. Lower abdominal pain. Positive D-dimer. Abnormal vaginal bleeding. EXAM: CT ANGIOGRAPHY CHEST CT ABDOMEN AND PELVIS WITH CONTRAST TECHNIQUE: Multidetector CT imaging of the chest was performed using the standard protocol during bolus administration of intravenous contrast. Multiplanar CT image reconstructions and MIPs were obtained to evaluate the vascular anatomy. Multidetector CT imaging of the abdomen and pelvis was performed using the standard protocol during bolus administration of intravenous contrast. CONTRAST:  100 mL Isovue 370 COMPARISON:  CT abdomen and pelvis 06/21/2017 FINDINGS: CTA CHEST FINDINGS Cardiovascular: Satisfactory opacification of the pulmonary arteries to the segmental level. No evidence of pulmonary embolism. Normal heart size. No pericardial effusion. Normal caliber thoracic aorta. No aortic dissection. Focal aortic calcification. Great vessel origins are patent. Mediastinum/Nodes: No enlarged mediastinal, hilar, or axillary lymph nodes. Thyroid gland, trachea, and esophagus demonstrate no significant findings. Lungs/Pleura: Atelectasis in the lung bases. No focal consolidation. No pleural effusions. No pneumothorax. Airways are  patent. Musculoskeletal: Degenerative changes in the spine. No destructive bone lesions. Review of the MIP images confirms the above findings. CT ABDOMEN and PELVIS FINDINGS Hepatobiliary: Mild diffuse fatty infiltration of the liver. Gallbladder and bile ducts are  unremarkable. Small amount of free fluid around the liver. Pancreas: Unremarkable. No pancreatic ductal dilatation or surrounding inflammatory changes. Spleen: Normal in size without focal abnormality. Small amount of free fluid around the spleen. Adrenals/Urinary Tract: Subcentimeter cyst in the left kidney. No solid mass identified. No hydronephrosis or hydroureter. Renal nephrograms are symmetrical. No adrenal gland nodules. Bladder is mostly decompressed without evidence of filling defect or significant wall thickening. Stomach/Bowel: Stomach, small bowel, and colon are not abnormally distended. Scattered stool in the colon. Small amount of free fluid is demonstrated in the anterior mesentery. No definite loculation. Possible nodular infiltration in the mesentery and omentum. Vascular/Lymphatic: Aortic atherosclerosis. No enlarged abdominal or pelvic lymph nodes. Distal IVC is focally flattened. No definite thrombus demonstrated in the IVC or iliac veins. Visualized common femoral veins also appear patent. No significant retroperitoneal lymphadenopathy. Prominent lymph nodes in the groin regions bilaterally measuring up to about 1.3 cm in maximal diameter. These are nonspecific and are probably reactive but lymphoproliferative disorder or metastatic disease not entirely excluded. Groin lymph nodes are similar to previous study. Reproductive: Prominent heterogeneous multinodular enlargement of the uterus, measuring up to about 7.8 x 10.3 cm. Many nodules have low-attenuation centrally suggesting multiple necrotic fibroids. Ovaries are not definitively identified but would be difficult to separate from the enlarged uterus. Cervical region appears  prominent. Endometrium is not separated from the multiple fibroids. Consider ultrasound of the pelvis for further characterization. Appearances are similar to the previous study. Other: No free air in the abdomen. Mild edema in the subcutaneous fat. Abdominal wall musculature appears intact. Musculoskeletal: No acute or significant osseous findings. Review of the MIP images confirms the above findings. IMPRESSION: 1. Mild diffuse fatty infiltration of the liver. 2. Small amount of free fluid in the abdomen likely to represent ascites. Possible nodular infiltration in the mesentery and omentum. Can't entirely exclude pseudomyxoma peritonei. 3. 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 Ovaries are not separately identified. Cervix appears prominent. Suggest ultrasound of the pelvis for further evaluation. 4. Visualized major abdominal and pelvic veins appear patent although there is focal flattening of the distal IVC of nonspecific etiology. 5. Nonspecific prominence of lymph nodes in the groin. 6. Aortic atherosclerosis. 7. No evidence of significant pulmonary embolus. 8. No evidence of active pulmonary disease. Electronically Signed   By: Lucienne Capers M.D.   On: 09/15/2017 22:58   Ct Abdomen Pelvis W Contrast  Result Date: 09/15/2017 CLINICAL DATA:  Intermittent mid upper abdominal pain for 1 week. Shortness of breath 2 days ago. Right leg pain and left leg swelling yesterday. Lower abdominal pain. Positive D-dimer. Abnormal vaginal bleeding. EXAM: CT ANGIOGRAPHY CHEST CT ABDOMEN AND PELVIS WITH CONTRAST TECHNIQUE: Multidetector CT imaging of the chest was performed using the standard protocol during bolus administration of intravenous contrast. Multiplanar CT image reconstructions and MIPs were obtained to evaluate the vascular anatomy. Multidetector CT imaging of the abdomen and pelvis was performed using the standard protocol during bolus administration of  intravenous contrast. CONTRAST:  100 mL Isovue 370 COMPARISON:  CT abdomen and pelvis 06/21/2017 FINDINGS: CTA CHEST FINDINGS Cardiovascular: Satisfactory opacification of the pulmonary arteries to the segmental level. No evidence of pulmonary embolism. Normal heart size. No pericardial effusion. Normal caliber thoracic aorta. No aortic dissection. Focal aortic calcification. Great vessel origins are patent. Mediastinum/Nodes: No enlarged mediastinal, hilar, or axillary lymph nodes. Thyroid gland, trachea, and esophagus demonstrate no significant findings. Lungs/Pleura: Atelectasis in the lung bases. No focal consolidation. No pleural  effusions. No pneumothorax. Airways are patent. Musculoskeletal: Degenerative changes in the spine. No destructive bone lesions. Review of the MIP images confirms the above findings. CT ABDOMEN and PELVIS FINDINGS Hepatobiliary: Mild diffuse fatty infiltration of the liver. Gallbladder and bile ducts are unremarkable. Small amount of free fluid around the liver. Pancreas: Unremarkable. No pancreatic ductal dilatation or surrounding inflammatory changes. Spleen: Normal in size without focal abnormality. Small amount of free fluid around the spleen. Adrenals/Urinary Tract: Subcentimeter cyst in the left kidney. No solid mass identified. No hydronephrosis or hydroureter. Renal nephrograms are symmetrical. No adrenal gland nodules. Bladder is mostly decompressed without evidence of filling defect or significant wall thickening. Stomach/Bowel: Stomach, small bowel, and colon are not abnormally distended. Scattered stool in the colon. Small amount of free fluid is demonstrated in the anterior mesentery. No definite loculation. Possible nodular infiltration in the mesentery and omentum. Vascular/Lymphatic: Aortic atherosclerosis. No enlarged abdominal or pelvic lymph nodes. Distal IVC is focally flattened. No definite thrombus demonstrated in the IVC or iliac veins. Visualized common femoral  veins also appear patent. No significant retroperitoneal lymphadenopathy. Prominent lymph nodes in the groin regions bilaterally measuring up to about 1.3 cm in maximal diameter. These are nonspecific and are probably reactive but lymphoproliferative disorder or metastatic disease not entirely excluded. Groin lymph nodes are similar to previous study. Reproductive: Prominent heterogeneous multinodular enlargement of the uterus, measuring up to about 7.8 x 10.3 cm. Many nodules have low-attenuation centrally suggesting multiple necrotic fibroids. Ovaries are not definitively identified but would be difficult to separate from the enlarged uterus. Cervical region appears prominent. Endometrium is not separated from the multiple fibroids. Consider ultrasound of the pelvis for further characterization. Appearances are similar to the previous study. Other: No free air in the abdomen. Mild edema in the subcutaneous fat. Abdominal wall musculature appears intact. Musculoskeletal: No acute or significant osseous findings. Review of the MIP images confirms the above findings. IMPRESSION: 1. Mild diffuse fatty infiltration of the liver. 2. Small amount of free fluid in the abdomen likely to represent ascites. Possible nodular infiltration in the mesentery and omentum. Can't entirely exclude pseudomyxoma peritonei. 3. 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 Ovaries are not separately identified. Cervix appears prominent. Suggest ultrasound of the pelvis for further evaluation. 4. Visualized major abdominal and pelvic veins appear patent although there is focal flattening of the distal IVC of nonspecific etiology. 5. Nonspecific prominence of lymph nodes in the groin. 6. Aortic atherosclerosis. 7. No evidence of significant pulmonary embolus. 8. No evidence of active pulmonary disease. Electronically Signed   By: Lucienne Capers M.D.   On: 09/15/2017 22:58   Dg Abd 2  Views  Result Date: 10/08/2017 CLINICAL DATA:  Unable to have bowel movements, pain and distension EXAM: ABDOMEN - 2 VIEW COMPARISON:  10/07/2017, 10/05/2017, CT 10/03/2016 FINDINGS: Subsegmental atelectasis at the bases. No free air beneath the diaphragm. Multiple gas-filled loops of small bowel measuring up to 3.6 cm with multiple fluid levels. Large amount of stool in the colon. Absence of colon and distal bowel gas. IMPRESSION: Slight increased gaseous dilatation of central small bowel with paucity of distal bowel gas. Findings may reflect slight worsening of bowel obstruction. Electronically Signed   By: Donavan Foil M.D.   On: 10/08/2017 20:55   Dg Abd 2 Views  Result Date: 10/07/2017 CLINICAL DATA:  Bowel obstruction. EXAM: ABDOMEN - 2 VIEW COMPARISON:  10/05/2017 FINDINGS: Air, contrast and stool is present within the colon. There are persistent  dilated air-filled small bowel loops in the central abdomen measuring up to 4.1 cm in diameter with air-fluid levels. Remainder of the exam is unchanged. IMPRESSION: Persistent air-filled dilated central small bowel loops with air-fluid levels compatible with persistent partial small bowel obstruction. Electronically Signed   By: Marin Olp M.D.   On: 10/07/2017 13:48   Dg Abd 2 Views  Result Date: 10/05/2017 CLINICAL DATA:  Abdominal distention and pain. EXAM: ABDOMEN - 2 VIEW COMPARISON:  Abdominal x-ray from yesterday. FINDINGS: Persistent mild dilatation of central small bowel loops in the mid abdomen with air-fluid levels. Findings are similar to prior study. Large colonic stool burden again noted. No pneumoperitoneum. No acute osseous abnormality. IMPRESSION: 1. Unchanged mild central small bowel dilatation. Electronically Signed   By: Titus Dubin M.D.   On: 10/05/2017 09:29   Dg Abd 2 Views  Result Date: 09/15/2017 CLINICAL DATA:  Patient states intermittent mid upper abdominal pain x 1 week with constipation. SOB 2 days ago. Hx of HTN.  Patient states no known heart, lung, or abdominal conditions or surgical hx. EXAM: ABDOMEN - 2 VIEW COMPARISON:  CT, 06/21/2017 FINDINGS: Normal bowel gas pattern. Mild increase colonic stool burden. No evidence of renal or ureteral stones. Soft tissues are unremarkable. No significant skeletal abnormality. Clear lung bases. IMPRESSION: 1. No acute findings.  No bowel obstruction. 2. Mild increase colonic stool burden. Electronically Signed   By: Lajean Manes M.D.   On: 09/15/2017 20:07   [place] Core Biopsy (lymph Nodes)  Result Date: 10/04/2017 INDICATION: 57 year old female with cervical cancer and enlarged left inguinal lymph nodes concerning for metastatic disease EXAM: Ultrasound-guided core biopsy of lymph nodes MEDICATIONS: None. ANESTHESIA/SEDATION: Moderate (conscious) sedation was employed during this procedure. A total of Versed 2 mg and Fentanyl 100 mcg was administered intravenously. Moderate Sedation Time: 14 minutes minutes. The patient's level of consciousness and vital signs were monitored continuously by radiology nursing throughout the procedure under my direct supervision. FLUOROSCOPY TIME:  Fluoroscopy Time: 0 minutes 0 seconds (0 mGy). COMPLICATIONS: None immediate. PROCEDURE: Informed written consent was obtained from the patient after a thorough discussion of the procedural risks, benefits and alternatives. All questions were addressed. Maximal Sterile Barrier Technique was utilized including caps, mask, sterile gowns, sterile gloves, sterile drape, hand hygiene and skin antiseptic. A timeout was performed prior to the initiation of the procedure. The left inguinal region was interrogated with ultrasound. Several prominent hypoechoic lymph nodes are identified. A suitable lymph node for biopsy was selected. The overlying skin was prepped and draped in standard fashion using chlorhexidine skin prep. Local anesthesia was attained by infiltration with 1% lidocaine. A small dermatotomy was made.  Under real-time sonographic guidance, multiple 18 gauge core biopsies were coaxially obtained using the bio Pince automated biopsy device. Biopsy specimens were placed in saline and delivered to pathology for further analysis. Post biopsy ultrasound imaging demonstrates no evidence of immediate complication. IMPRESSION: Ultrasound-guided core biopsy left inguinal lymph nodes. Electronically Signed   By: Jacqulynn Cadet M.D.   On: 10/04/2017 14:48   [place] Ekg Site Rite  Result Date: 10/08/2017 If Site Rite image not attached, placement could not be confirmed due to current cardiac rhythm.  [place] Ekg Site Rite  Result Date: 10/08/2017 If Site Rite image not attached, placement could not be confirmed due to current cardiac rhythm.  US Abdomen Limited Ruq  Result Date: 09/15/2017 CLINICAL DATA:  Right upper quadrant pain for 1 week EXAM: ULTRASOUND ABDOMEN LIMITED RIGHT UPPER QUADRANT COMPARISON:  None. FINDINGS: Gallbladder: Gallbladder is decompressed with pseudo wall thickening. Gallbladder sludge is noted. No definitive calculi are seen. No pericholecystic fluid is noted. Common bile duct: Diameter: 2.9 mm. Liver: No focal lesion identified. Within normal limits in parenchymal echogenicity. Portal vein is patent on color Doppler imaging with normal direction of blood flow towards the liver. Minimal perihepatic ascites is noted of uncertain significance. IMPRESSION: Minimal ascites. Gallbladder sludge and evidence of gallbladder decompression. No cholelithiasis is noted. Electronically Signed   By: Inez Catalina M.D.   On: 09/15/2017 19:35    Microbiology: No results found for this or any previous visit (from the past 240 hour(s)).   Labs: Basic Metabolic Panel: Recent Labs  Lab 10/04/17 0813 10/05/17 0452 10/06/17 0430 10/07/17 0431 10/08/17 0416  NA 135 139 138 140 139  K 4.5 4.5 4.3 5.4* 5.3*  CL 98* 103 105 109 107  CO2 25 23 22  20* 20*  GLUCOSE 132* 128* 134* 127* 139*  BUN 65* 56* 42*  36* 40*  CREATININE 2.09* 1.50* 1.20* 1.17* 2.09*  CALCIUM 8.9 8.9 8.7* 8.8* 9.3  MG 2.3 2.3 2.1 2.3 2.2   Liver Function Tests: Recent Labs  Lab 09/11/2017 0830 10/04/17 0813 10/05/17 0452 10/08/17 0416  AST 37 23 27 25   ALT 28 22 24 25   ALKPHOS 53 48 48 57  BILITOT 1.0 0.6 0.4 0.7  PROT 7.8 6.9 7.2 8.0  ALBUMIN 2.7* 2.4* 2.4* 2.5*   No results for input(s): LIPASE, AMYLASE in the last 168 hours. No results for input(s): AMMONIA in the last 168 hours. CBC: Recent Labs  Lab 10/04/17 0813 10/05/17 0452 10/06/17 0430 10/07/17 0431 10/08/17 0416  WBC 13.1* 13.1* 12.2* 14.5* 17.2*  NEUTROABS 10.8* 10.6* 9.9* 11.5* 15.1*  HGB 8.7* 8.9* 9.0* 8.8* 9.3*  HCT 27.6* 28.3* 29.0* 28.0* 30.5*  MCV 83.4 82.7 84.3 83.3 85.0  PLT 286 306 291 270 270   Cardiac Enzymes: No results for input(s): CKTOTAL, CKMB, CKMBINDEX, TROPONINI in the last 168 hours. D-Dimer No results for input(s): DDIMER in the last 72 hours. BNP: Invalid input(s): POCBNP CBG: No results for input(s): GLUCAP in the last 168 hours. Anemia work up No results for input(s): VITAMINB12, FOLATE, FERRITIN, TIBC, IRON, RETICCTPCT in the last 72 hours. Urinalysis    Component Value Date/Time   COLORURINE YELLOW 09/15/2017 1832   APPEARANCEUR CLEAR 09/15/2017 1832   LABSPEC 1.016 09/15/2017 1832   PHURINE 6.0 09/15/2017 1832   GLUCOSEU NEGATIVE 09/15/2017 1832   HGBUR MODERATE (A) 09/15/2017 1832   BILIRUBINUR NEGATIVE 09/15/2017 1832   KETONESUR NEGATIVE 09/15/2017 1832   PROTEINUR NEGATIVE 09/15/2017 1832   NITRITE NEGATIVE 09/15/2017 1832   LEUKOCYTESUR MODERATE (A) 09/15/2017 1832   Sepsis Labs Invalid input(s): PROCALCITONIN,  WBC,  LACTICIDVEN     SIGNED:  Aline August, MD  [facility] 2017/11/03, 1:38 PM Pager: [phone]  If 7PM-7AM, please contact night-coverage [email or web address] Password [email or web address]

## 2017-10-10 NOTE — Progress Notes (Signed)
Received call from patients mother Delrae Alfred.  After numerous unsuccessful attempts to reach her on her phone listed in the emergency contacts, I requested that the police go to her home and have her call me.  Ms Renard Hamper was informed by the police of her daughters death.  She had questions for me regarding what caused the death of her daughter and I suggested that she let me have a physician contact her.  I reached out to Dr. Alvy Bimler, the oncologist and as she was scheduled to have her first meeting with Ms Purdie today she deferred to Joylene John, NP who had a working relationship with the patient.  Ms Renard Hamper was informed of procedure for calling our nursing supervisor with a funeral home name once it was determined.  I informed Ms Renard Hamper that her daughter was in our morgue here at [facility].

## 2017-10-10 NOTE — Progress Notes (Signed)
Patient phone charged but locked with a pass code unable to open. Will continue to try and notified patient next of kin.

## 2017-10-10 NOTE — Progress Notes (Signed)
At [place] patient was found on the floor by RN. Patient was conscious and responsive. Rapid respond  was called. Upon attempting to transferred patient off the floor to bed patient went unresponsive. Code blue was called and CPR was initiated immediately. Dr. Dina Rich courtney was in charged of the code and Dr. Silas Sacramento was at bedside. Patient mother Ewing Schlein was called on her cell phone number [phone] by RN but was unreachable and her number was out of service. Patient was on a PCA dilaudid that was initiated at 2322 and patient had a total of  1.2mg  since initiated. Patient was last seen alert and responsive between 0245-0250 sitting at the bedside crying in pain and patient was encourage to get in her bed and use her PCA. Patient stated she was not comfortable in bed and that it was better her sitting up. After CPR was stopped patient was pronounced death at 61. Patient mother was called again and her number is still out of service. Patient cell phone was found but out of charge and currently charging with the hope to get working number for her mother.

## 2017-10-10 NOTE — Progress Notes (Signed)
  Note classification  Patient was last seen her normal between 0245-0250 sitting at the bedside crying in pain and patient was encouraged to get in her bed and use her PCA. Patient stated she was not comfortable in bed and that it was better her sitting up. At [place] patient was found on the floor by RN covered in vomit. Patient was conscious and responsive at the time murning. Rapid respond  was called. Upon attempting to transferred patient off the floor to bed patient went unresponsive. Code blue was called and CPR was initiated immediately. After CPR was stopped patient was pronounced death at 24. Organ donor was called and multiple attempted have been made trying to reach patient mother.

## 2017-10-10 NOTE — Telephone Encounter (Signed)
Attempted to contact patient's mother since Dr. Denman George was trying to contact her but the number given [phone] was incorrect.

## 2017-10-10 NOTE — Progress Notes (Signed)
I reached out to Ms [facility] to ask her to call the Patient Placement phone number directly with the funeral home information.  At this time, Ms Black informed me that she would like to have an autopsy.  Per the advice of [facility] Luz Brazen, I asked Ms [facility] to call the Patient Pacement number directly with this new request.

## 2017-10-10 NOTE — ED Provider Notes (Signed)
3:52 AM Responded to CODE BLUE on the third floor.  Upon my arrival, nursing staff performing CPR to the patient lying on the floor.  Per nursing staff, they have been performing CPR for 8 minutes.  She had received 1 dose of epinephrine.  She was on the monitor.  I assumed lead of CPR.  Additional epinephrine ordered.  Pulse check without perfusable rhythm initially.  Appeared to be PEA.  Large amount of what appeared to be coffee-ground emesis on the floor.  Patient was being bagged.  On second pulse check, patient had a wide-complex perfusable rhythm on the monitor.  She was also taking agonal respirations.  Took the opportunity to transfer patient to the bed.  However, she bradycardia down and lost pulses again.  Patient was intubated by me with good end-tidal CO2.  CPR continued.  Patient received multiple doses of epinephrine, bicarbonate, and 1 dose of calcium chloride.  Standard ACLS was followed for greater than 30 minutes with no evidence of perfusable rhythm on the monitor.  Patient remained in PEA.  She had an unclear downtime.  Last seen normal was 2:45 AM.  Found by nursing at 3:45 AM.  She was not on the monitor at that time.  Pupils 7 mm and fixed.  Last pulse check at 424.  No pulse.  Bedside cardiac ultrasound without cardiac activity.  Given poor neurologic status and standard ACLS protocol with unknown downtime, decision was made to stop CPR.  Staff in the room agreed with this decision.  Time of death 79.  INTUBATION Performed by: Merryl Hacker  Required items: required blood products, implants, devices, and special equipment available Patient identity confirmed: provided demographic data and hospital-assigned identification number Time out: Immediately prior to procedure a "time out" was called to verify the correct patient, procedure, equipment, support staff and site/side marked as required.  Indications: Arrest  Intubation method: Direct Laryngoscopy  Preoxygenation:  BVM  Sedatives: none Paralytic: none  Tube Size: 7.5 cuffed  Post-procedure assessment: chest rise and ETCO2 monitor Breath sounds: diminished on the left Tube secured with: ETT holder  Chest x-ray findings: none  Patient tolerated the procedure well with no immediate complications.  Cardiopulmonary Resuscitation (CPR) Procedure Note Directed/Performed by: Merryl Hacker I personally directed ancillary staff and/or performed CPR in an effort to regain return of spontaneous circulation and to maintain cardiac, neuro and systemic perfusion.   Angiocath insertion Performed by: Merryl Hacker  Consent: Verbal consent obtained. Risks and benefits: risks, benefits and alternatives were discussed Time out: Immediately prior to procedure a "time out" was called to verify the correct patient, procedure, equipment, support staff and site/side marked as required.  Preparation: Patient was prepped and draped in the usual sterile fashion.  Vein Location: right IJ   Gauge: 18 G  Normal blood return and flush without difficulty Patient tolerance: Patient tolerated the procedure well with no immediate complications.  CRITICAL CARE Performed by: Merryl Hacker   Total critical care time: 50 minutes  Critical care time was exclusive of separately billable procedures and treating other patients.  Critical care was necessary to treat or prevent imminent or life-threatening deterioration.  Critical care was time spent personally by me on the following activities: development of treatment plan with patient and/or surrogate as well as nursing, discussions with consultants, evaluation of patient's response to treatment, examination of patient, obtaining history from patient or surrogate, ordering and performing treatments and interventions, ordering and review of laboratory studies, ordering  and review of radiographic studies, pulse oximetry and re-evaluation of patient's  condition.      Merryl Hacker, MD Oct 23, 2017 [phone]

## 2017-10-10 DEATH — deceased

## 2017-10-11 ENCOUNTER — Ambulatory Visit (HOSPITAL_COMMUNITY): Payer: 59

## 2017-10-11 ENCOUNTER — Ambulatory Visit: Payer: 59 | Admitting: Gynecologic Oncology

## 2019-09-02 IMAGING — US US ABDOMEN LIMITED
1 series · 14 of 25 positions shown · non-contrast
Comparison: None.

CLINICAL DATA: Right upper quadrant pain for 1 week

EXAM:
ULTRASOUND ABDOMEN LIMITED RIGHT UPPER QUADRANT

[Series 1: us abdomen limited · 0.20mm/px · 14 of 35 slices shown]
[im 1/35]
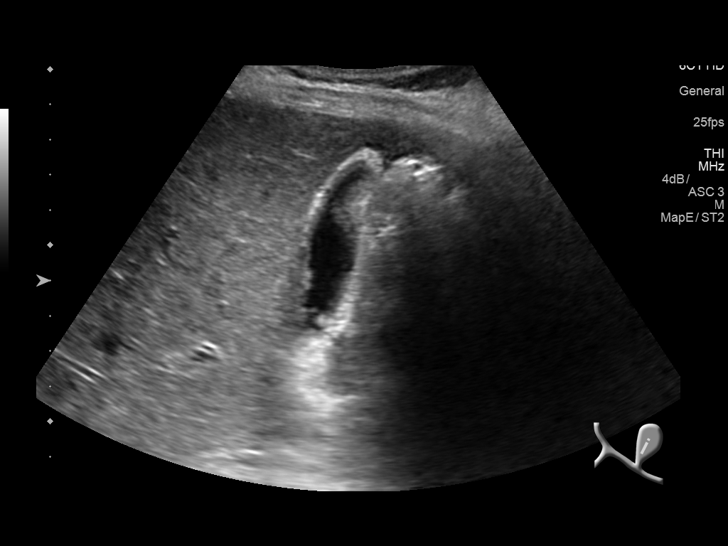
[im 3/35]
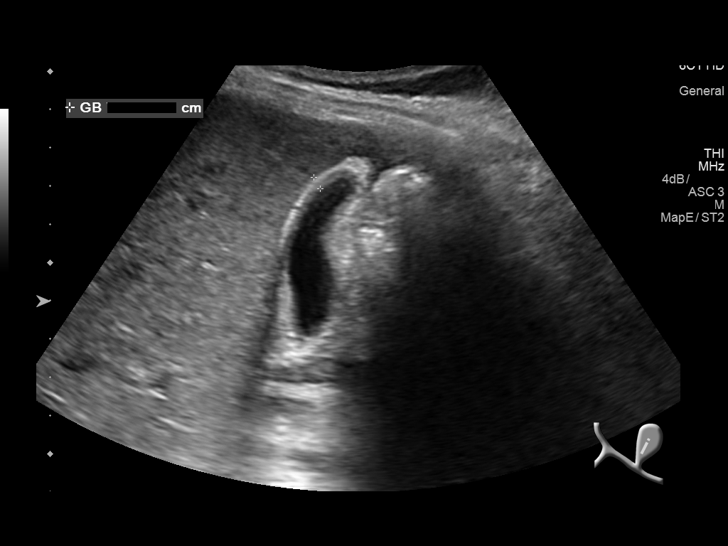
[im 6/35]
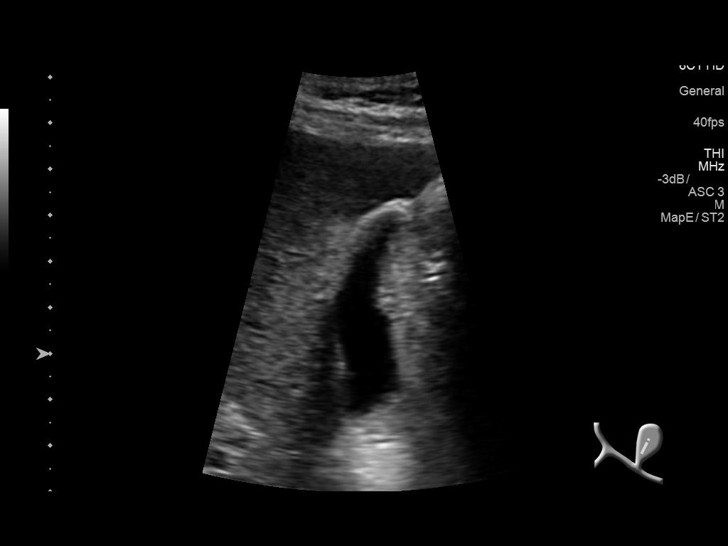
[im 9/35]
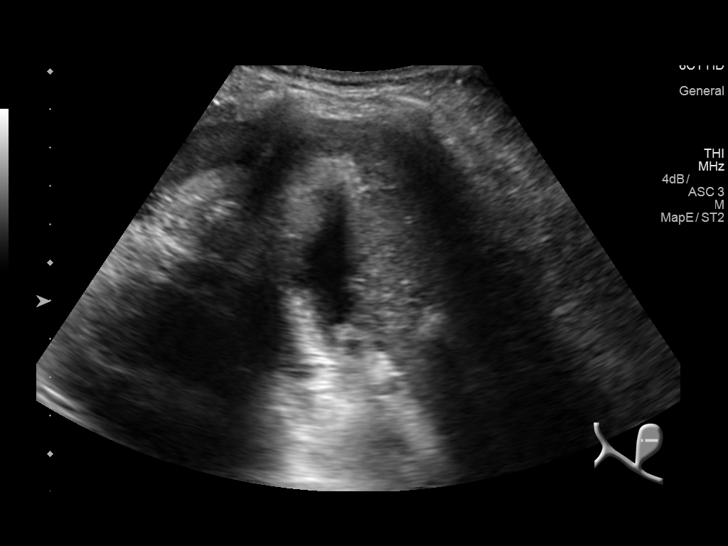
[im 12/35]
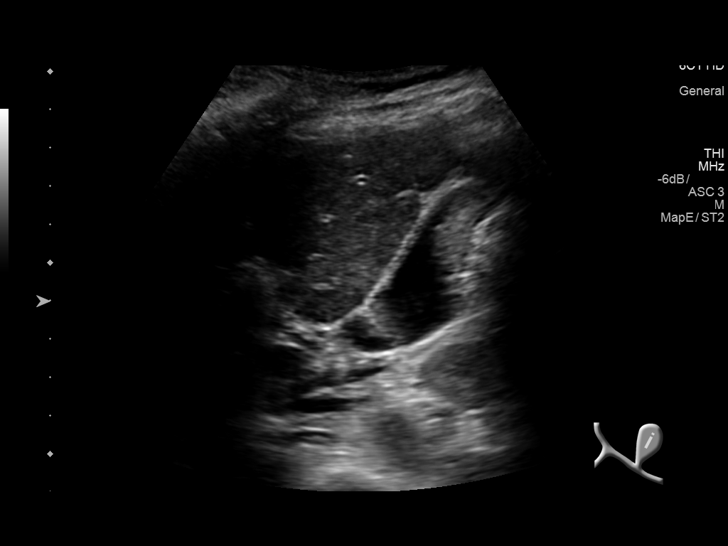
[im 13/35]
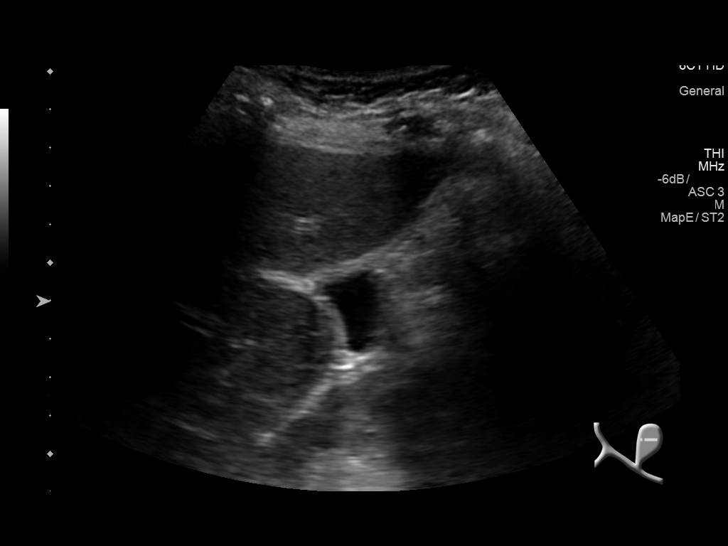
[im 16/35]
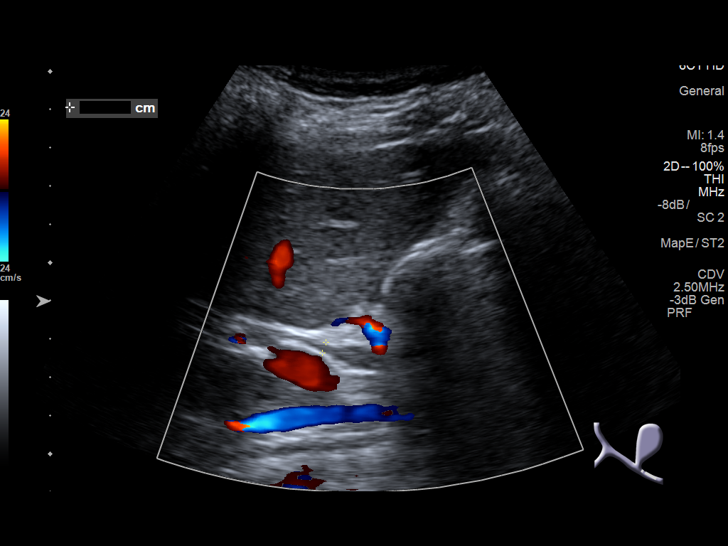
[im 19/35]
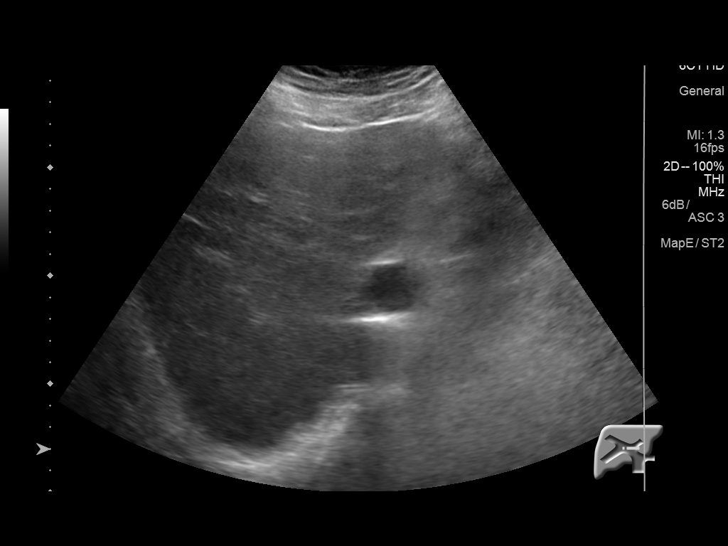
[im 22/35]
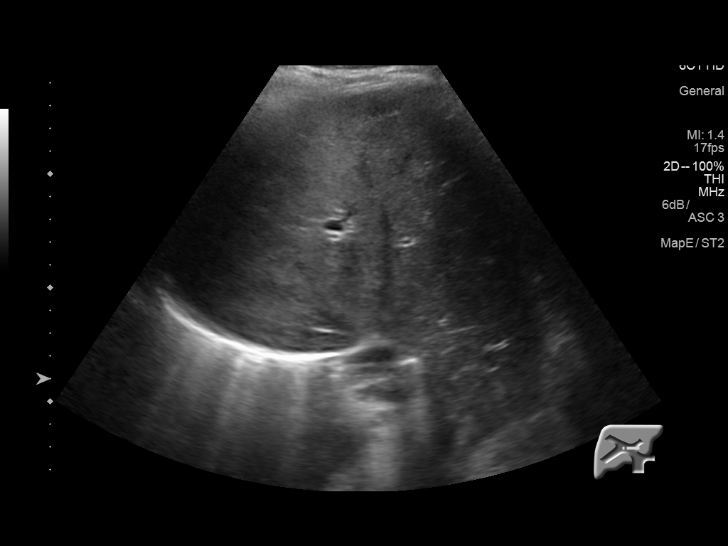
[im 23/35]
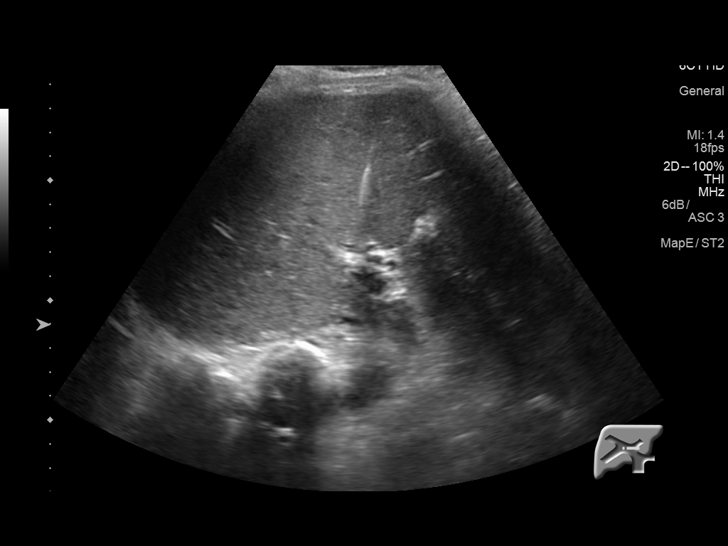
[im 26/35]
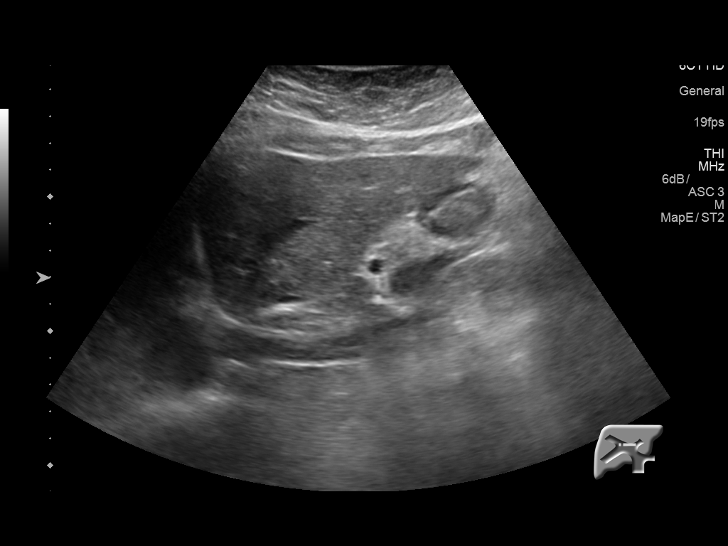
[im 29/35]
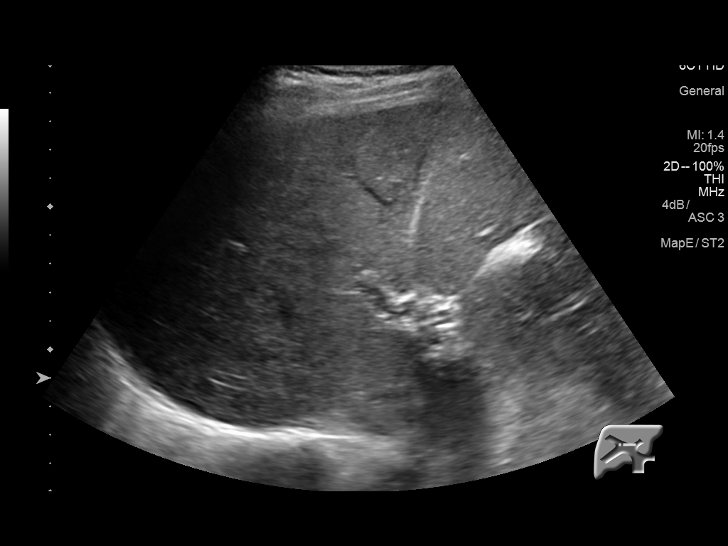
[im 32/35]
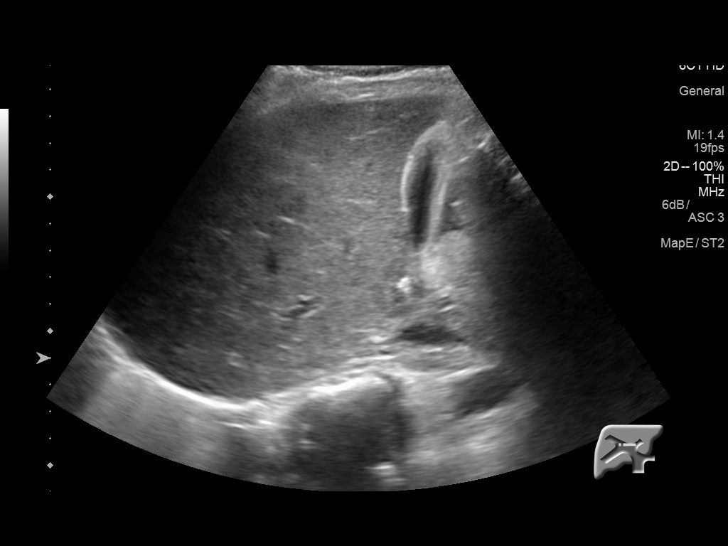
[im 35/35]
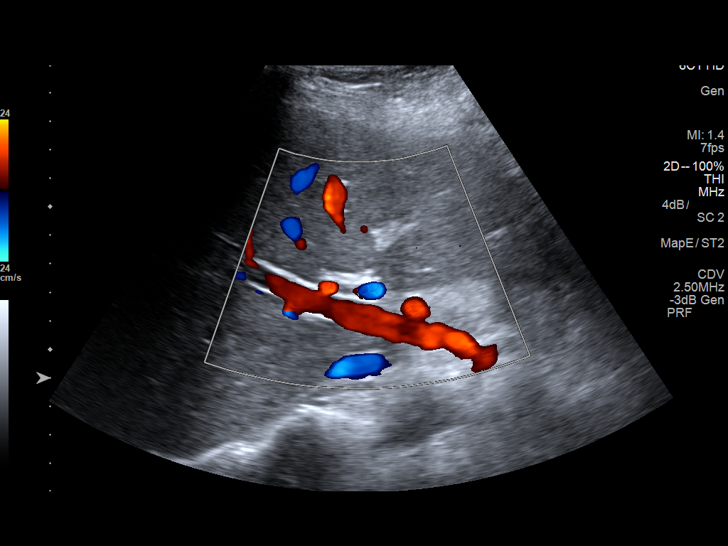

[14 of 25 positions shown; findings below may reference images not displayed]

FINDINGS: Gallbladder:

Gallbladder is decompressed with pseudo wall thickening. Gallbladder
sludge is noted. No definitive calculi are seen. No pericholecystic
fluid is noted.

Common bile duct:

Diameter: 2.9 mm.

Liver:

No focal lesion identified. Within normal limits in parenchymal
echogenicity. Portal vein is patent on color Doppler imaging with
normal direction of blood flow towards the liver.

Minimal perihepatic ascites is noted of uncertain significance.
IMPRESSION: Minimal ascites.

Gallbladder sludge and evidence of gallbladder decompression. No
cholelithiasis is noted.

## 2019-10-05 IMAGING — CT CT ABD-PELV W/ CM
3 of 11 series · 11 of 46 positions shown, 16 images · IV contrast (ISOVUE)
Comparison: CT abdomen and pelvis 06/21/2017

CLINICAL DATA: Intermittent mid upper abdominal pain for 1 week.
Shortness of breath 2 days ago. Right leg pain and left leg swelling
yesterday. Lower abdominal pain. Positive D-dimer. Abnormal vaginal
bleeding.

EXAM:
CT ANGIOGRAPHY CHEST
CT ABDOMEN AND PELVIS WITH CONTRAST
TECHNIQUE: Multidetector CT imaging of the chest was performed using the
standard protocol during bolus administration of intravenous
contrast. Multiplanar CT image reconstructions and MIPs were
obtained to evaluate the vascular anatomy. Multidetector CT imaging
of the abdomen and pelvis was performed using the standard protocol
during bolus administration of intravenous contrast.
CONTRAST:  100 mL Isovue 370

[Series 4: axial st · axial · 0.81mm/px · z∈[-684,-454]mm · 3 of 93 slices shown, 7 images]
[im 24/93  soft-tissue]
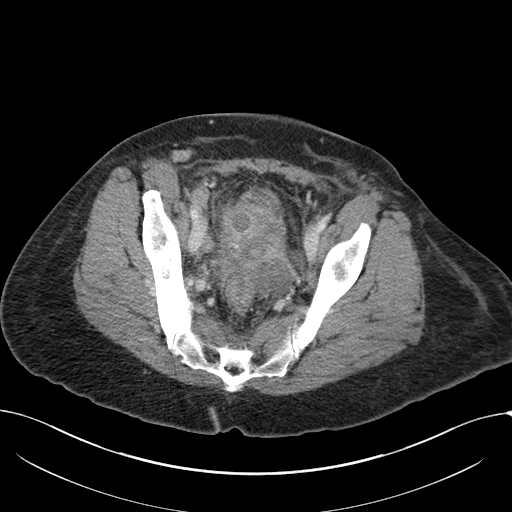
[im 24/93  lung]
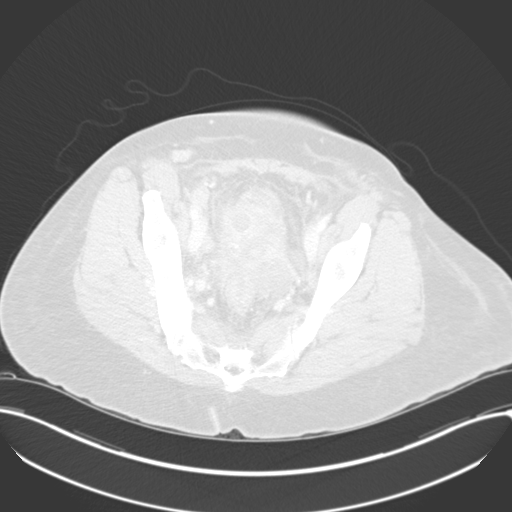
[im 24/93  bone]
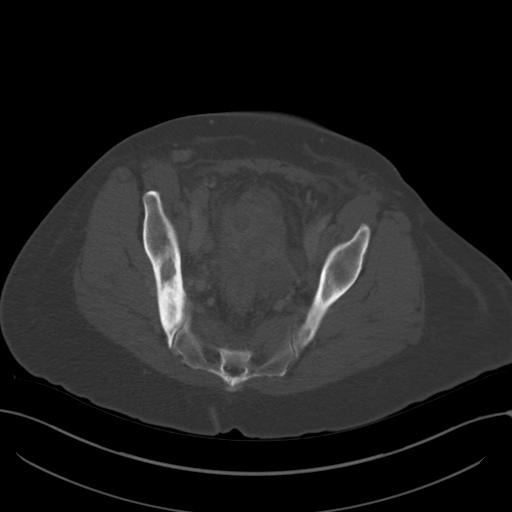
[im 47/93  soft-tissue]
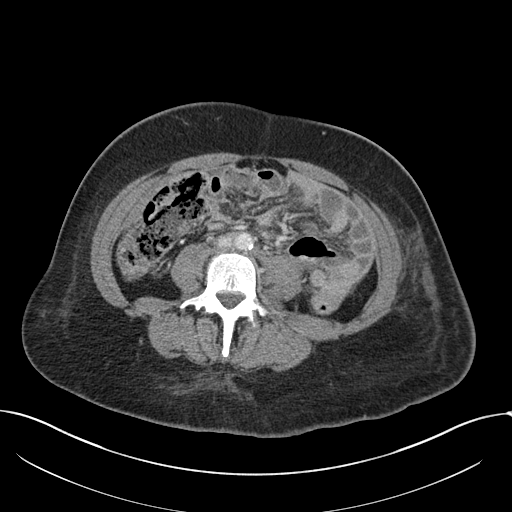
[im 47/93  lung]
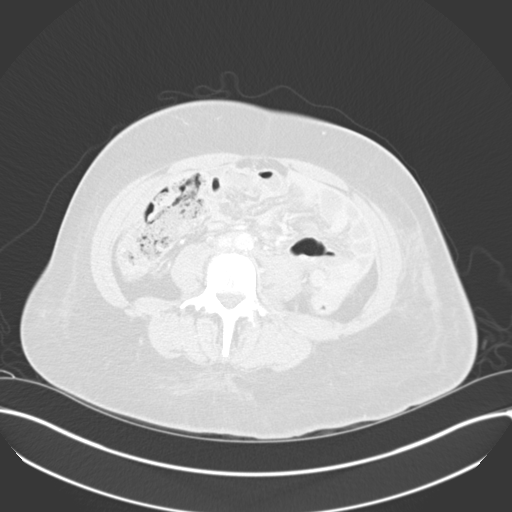
[im 70/93  soft-tissue]
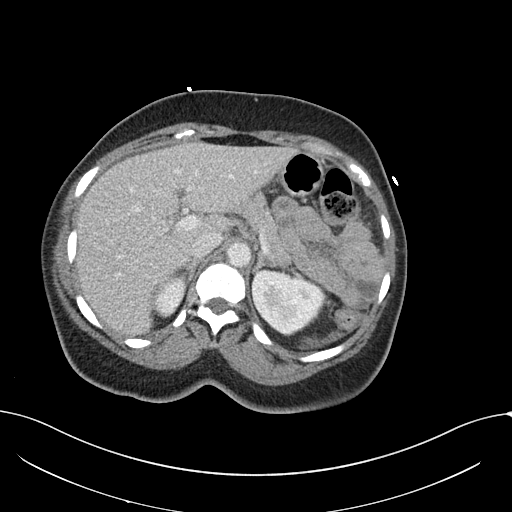
[im 70/93  lung]
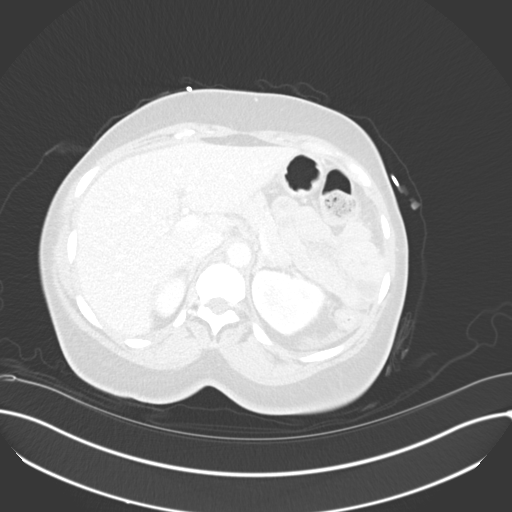

[Series 8: thins · axial · 0.71mm/px · z∈[-432,-267]mm · 7 of 232 slices shown]
[im 17/232  soft-tissue]
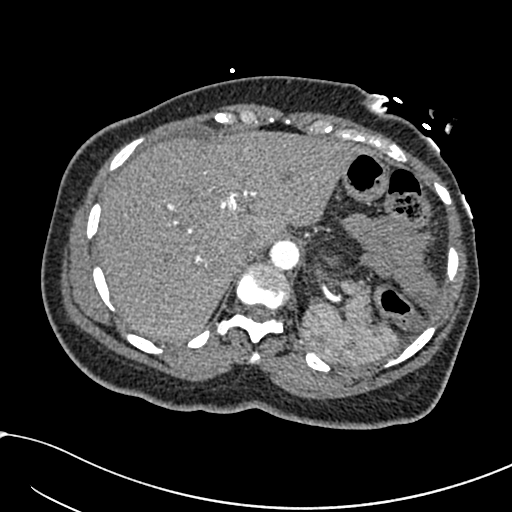
[im 50/232  soft-tissue]
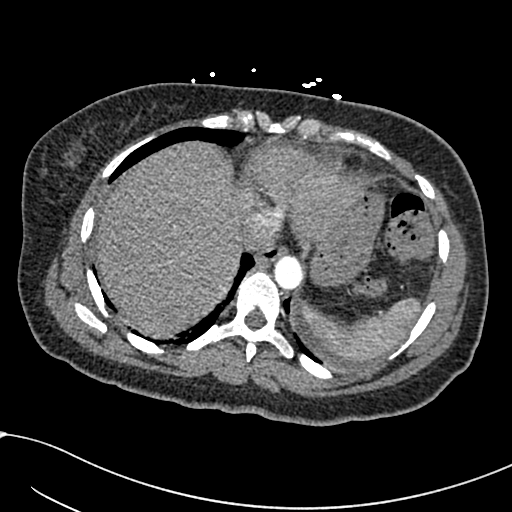
[im 83/232  soft-tissue]
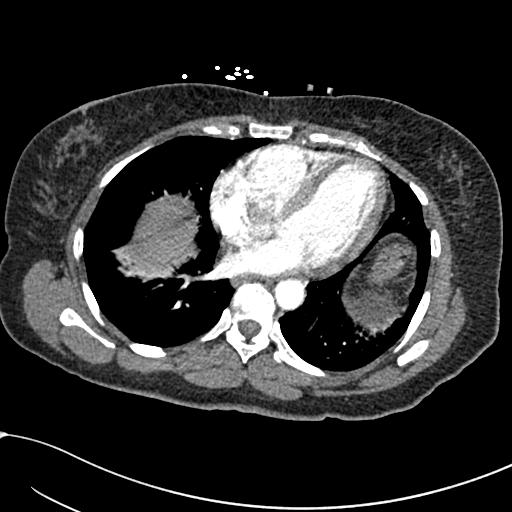
[im 100/232  soft-tissue]
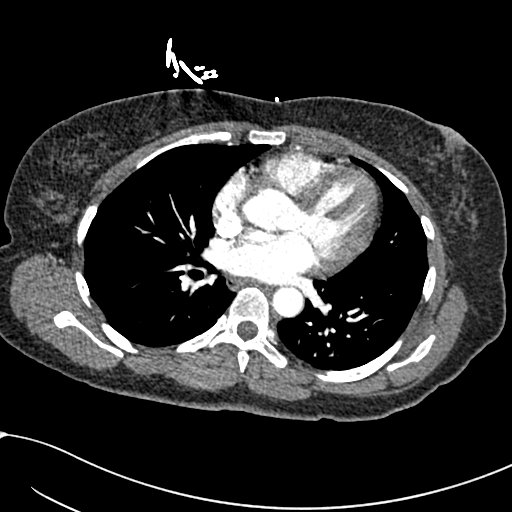
[im 133/232  soft-tissue]
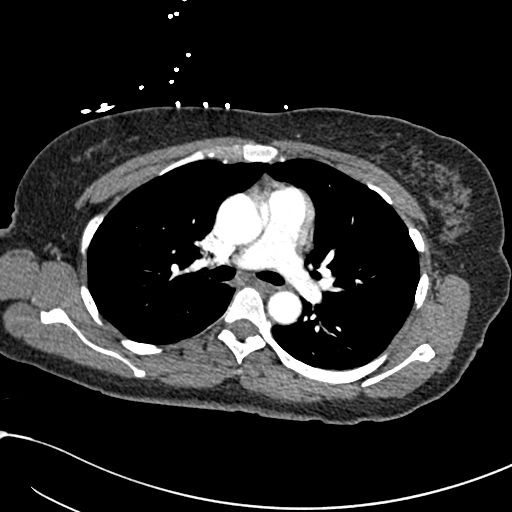
[im 149/232  soft-tissue]
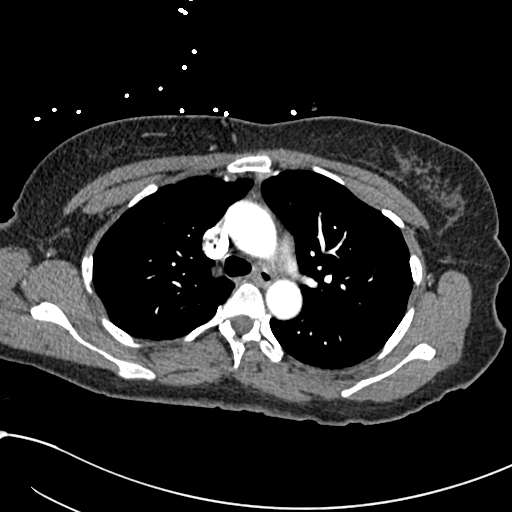
[im 182/232  soft-tissue]
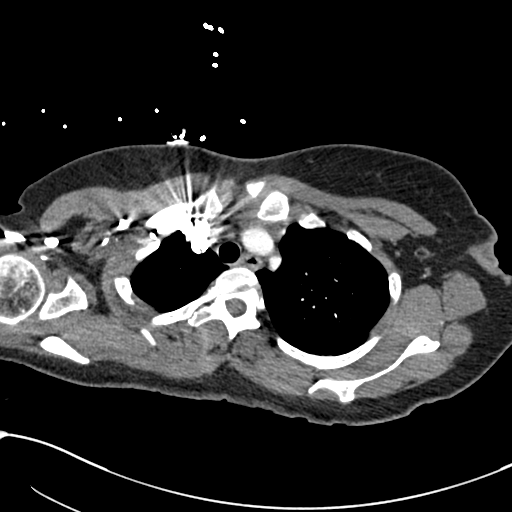

[Series 9: coronal mpr · coronal · 0.49mm/px · 1 of 151 slices shown, 2 images]
[im 76/151  soft-tissue]
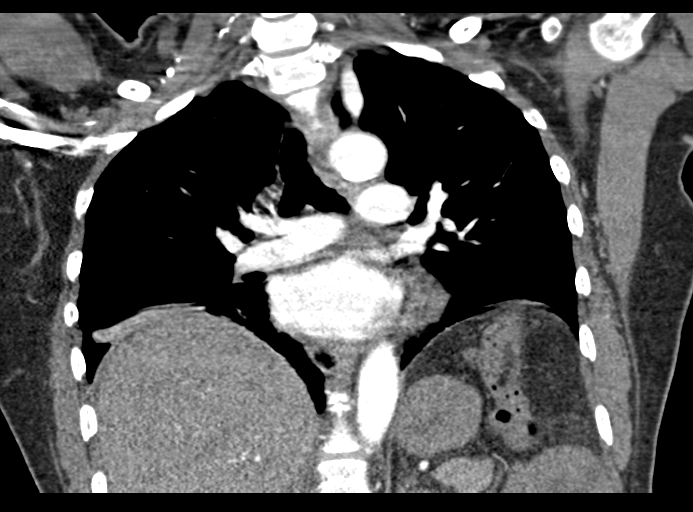
[im 76/151  bone]
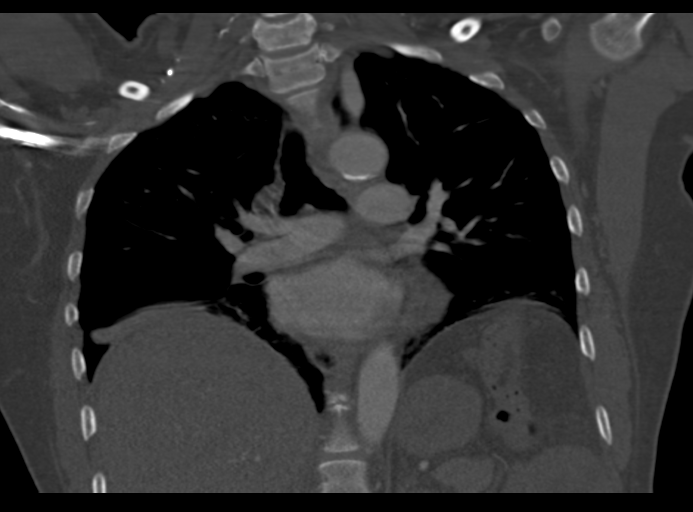

[11 of 46 positions shown; findings below may reference images not displayed]

FINDINGS: CTA CHEST FINDINGS

Cardiovascular: Satisfactory opacification of the pulmonary arteries
to the segmental level. No evidence of pulmonary embolism. Normal
heart size. No pericardial effusion. Normal caliber thoracic aorta.
No aortic dissection. Focal aortic calcification. Great vessel
origins are patent.

Mediastinum/Nodes: No enlarged mediastinal, hilar, or axillary lymph
nodes. Thyroid gland, trachea, and esophagus demonstrate no
significant findings.

Lungs/Pleura: Atelectasis in the lung bases. No focal consolidation.
No pleural effusions. No pneumothorax. Airways are patent.

Musculoskeletal: Degenerative changes in the spine. No destructive
bone lesions.

Review of the MIP images confirms the above findings.

CT ABDOMEN and PELVIS FINDINGS

Hepatobiliary: Mild diffuse fatty infiltration of the liver.
Gallbladder and bile ducts are unremarkable. Small amount of free
fluid around the liver.

Pancreas: Unremarkable. No pancreatic ductal dilatation or
surrounding inflammatory changes.

Spleen: Normal in size without focal abnormality. Small amount of
free fluid around the spleen.

Adrenals/Urinary Tract: Subcentimeter cyst in the left kidney. No
solid mass identified. No hydronephrosis or hydroureter. Renal
nephrograms are symmetrical. No adrenal gland nodules. Bladder is
mostly decompressed without evidence of filling defect or
significant wall thickening.

Stomach/Bowel: Stomach, small bowel, and colon are not abnormally
distended. Scattered stool in the colon. Small amount of free fluid
is demonstrated in the anterior mesentery. No definite loculation.
Possible nodular infiltration in the mesentery and omentum.

Vascular/Lymphatic: Aortic atherosclerosis. No enlarged abdominal or
pelvic lymph nodes. Distal IVC is focally flattened. No definite
thrombus demonstrated in the IVC or iliac veins. Visualized common
femoral veins also appear patent. No significant retroperitoneal
lymphadenopathy. Prominent lymph nodes in the groin regions
bilaterally measuring up to about 1.3 cm in maximal diameter. These
are nonspecific and are probably reactive but lymphoproliferative
disorder or metastatic disease not entirely excluded. Groin lymph
nodes are similar to previous study.

Reproductive: Prominent heterogeneous multinodular enlargement of
the uterus, measuring up to about 7.8 x 10.3 cm. Many nodules have
low-attenuation centrally suggesting multiple necrotic fibroids.
Ovaries are not definitively identified but would be difficult to
separate from the enlarged uterus. Cervical region appears
prominent. Endometrium is not separated from the multiple fibroids.
Consider ultrasound of the pelvis for further characterization.
Appearances are similar to the previous study.

Other: No free air in the abdomen. Mild edema in the subcutaneous
fat. Abdominal wall musculature appears intact.

Musculoskeletal: No acute or significant osseous findings.

Review of the MIP images confirms the above findings.
IMPRESSION: 1. Mild diffuse fatty infiltration of the liver.
2. Small amount of free fluid in the abdomen likely to represent
ascites. Possible nodular infiltration in the mesentery and omentum.
Can't entirely exclude pseudomyxoma peritonei.
3. Heterogeneous multinodular enlargement of the uterus suggesting
multiple necrotic fibroids. Ovaries are not separately identified.
Cervix appears prominent. Suggest ultrasound of the pelvis for
further evaluation.
4. Visualized major abdominal and pelvic veins appear patent
although there is focal flattening of the distal IVC of nonspecific
etiology.
5. Nonspecific prominence of lymph nodes in the groin.
6. Aortic atherosclerosis.
7. No evidence of significant pulmonary embolus.
8. No evidence of active pulmonary disease.

## 2019-10-05 IMAGING — CR DG CHEST 2V
2 series · 2 of 2 positions shown · non-contrast
Comparison: None.

CLINICAL DATA: Shortness of breath

EXAM:
CHEST - 2 VIEW

[w chest pa]
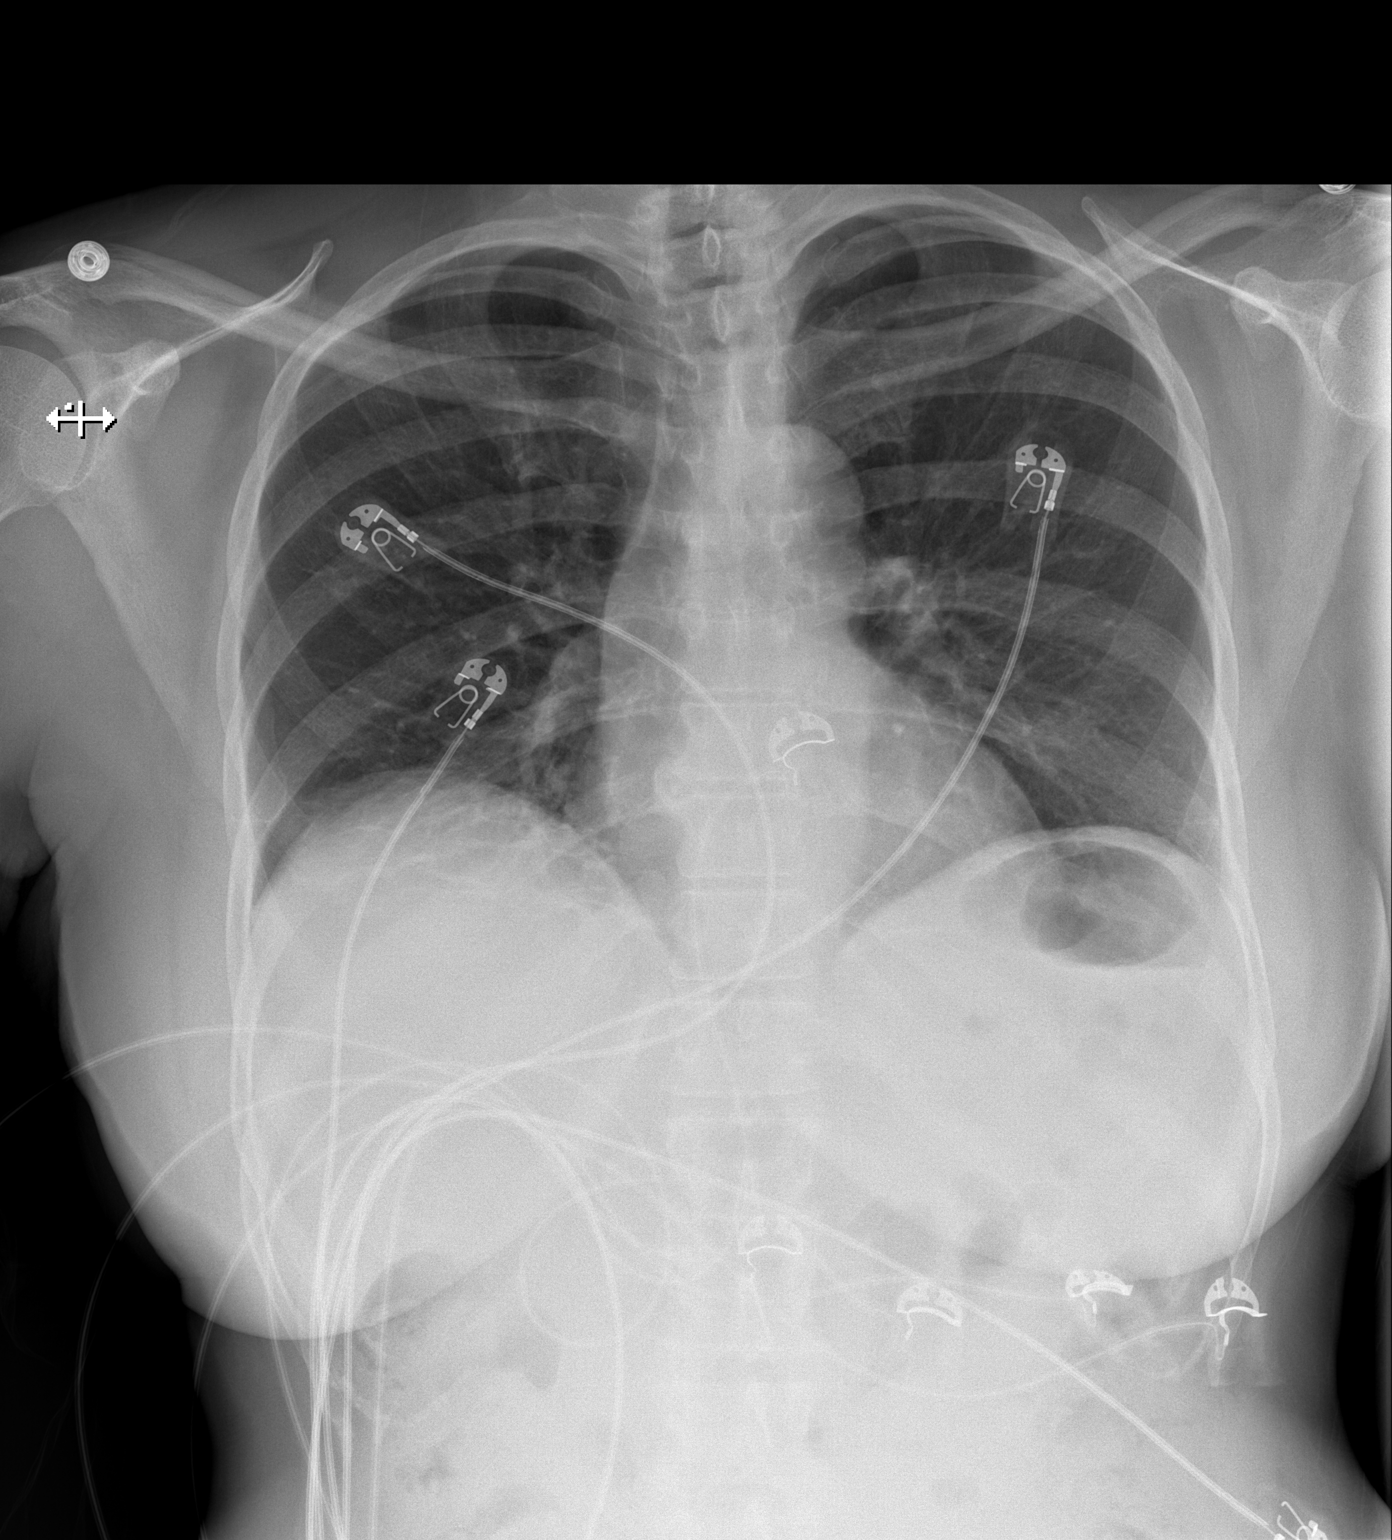

[w chest lat]
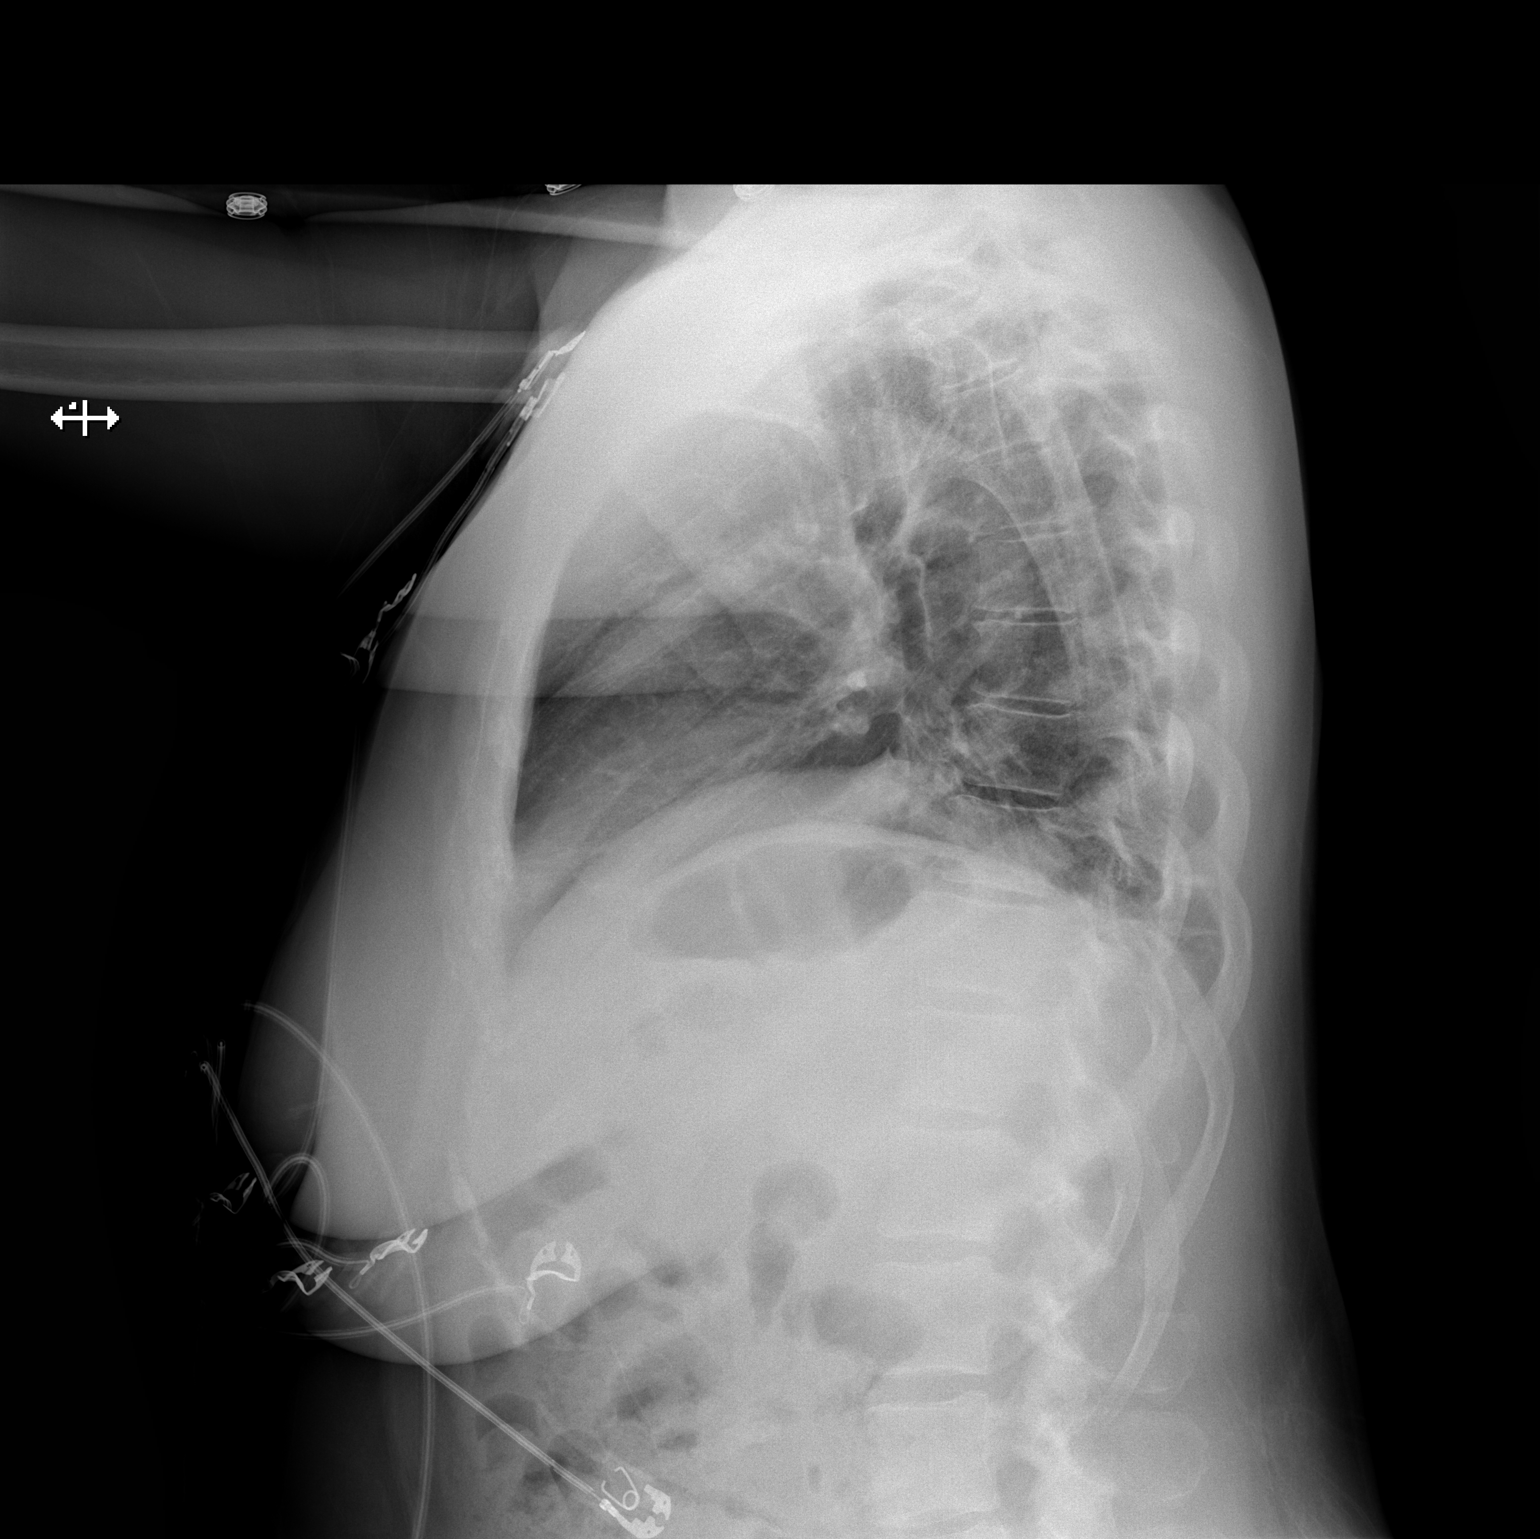

[2 of 2 positions shown; findings below may reference images not displayed]

FINDINGS: Low volume film. Streaky opacity in the posterior right lung base
compatible with atelectasis or possible pneumonia. Left lung clear.
The cardiopericardial silhouette is within normal limits for size.
The visualized bony structures of the thorax are intact. Telemetry
leads overlie the chest.
IMPRESSION: Posterior right basilar opacity compatible with atelectasis or
pneumonia.
